# Patient Record
Sex: Male | Born: 1965 | Race: Black or African American | Hispanic: No | Smoking: Never smoker
Health system: Southern US, Community
[De-identification: ages and names within clinical notes are randomized; demographics above are authoritative.]

## PROBLEM LIST (undated history)

## (undated) DIAGNOSIS — E119 Type 2 diabetes mellitus without complications: Secondary | ICD-10-CM

## (undated) DIAGNOSIS — Z8619 Personal history of other infectious and parasitic diseases: Secondary | ICD-10-CM

## (undated) DIAGNOSIS — I10 Essential (primary) hypertension: Secondary | ICD-10-CM

## (undated) DIAGNOSIS — B1910 Unspecified viral hepatitis B without hepatic coma: Secondary | ICD-10-CM

## (undated) DIAGNOSIS — Z227 Latent tuberculosis: Secondary | ICD-10-CM

## (undated) HISTORY — PX: LIVER TRANSPLANT: SHX410

## (undated) HISTORY — PX: GASTROSTOMY TUBE PLACEMENT: SHX655

---

## 2016-12-27 HISTORY — PX: VENTRAL HERNIA REPAIR: SHX424

## 2017-08-27 DIAGNOSIS — I201 Angina pectoris with documented spasm: Secondary | ICD-10-CM

## 2017-08-27 HISTORY — DX: Angina pectoris with documented spasm: I20.1

## 2018-12-11 HISTORY — PX: VENTRAL HERNIA REPAIR: SHX424

## 2019-01-27 DIAGNOSIS — S069XAA Unspecified intracranial injury with loss of consciousness status unknown, initial encounter: Secondary | ICD-10-CM

## 2019-01-27 DIAGNOSIS — S066X9A Traumatic subarachnoid hemorrhage with loss of consciousness of unspecified duration, initial encounter: Secondary | ICD-10-CM

## 2019-01-27 DIAGNOSIS — S066XAA Traumatic subarachnoid hemorrhage with loss of consciousness status unknown, initial encounter: Secondary | ICD-10-CM

## 2019-01-27 DIAGNOSIS — S069X9A Unspecified intracranial injury with loss of consciousness of unspecified duration, initial encounter: Secondary | ICD-10-CM

## 2019-01-27 HISTORY — DX: Traumatic subarachnoid hemorrhage with loss of consciousness status unknown, initial encounter: S06.6XAA

## 2019-01-27 HISTORY — DX: Traumatic subarachnoid hemorrhage with loss of consciousness of unspecified duration, initial encounter: S06.6X9A

## 2019-01-27 HISTORY — DX: Unspecified intracranial injury with loss of consciousness status unknown, initial encounter: S06.9XAA

## 2019-01-27 HISTORY — DX: Unspecified intracranial injury with loss of consciousness of unspecified duration, initial encounter: S06.9X9A

## 2019-02-24 ENCOUNTER — Emergency Department: Payer: Medicaid Other

## 2019-02-24 ENCOUNTER — Encounter: Payer: Self-pay | Admitting: Radiology

## 2019-02-24 ENCOUNTER — Emergency Department
Admission: EM | Admit: 2019-02-24 | Discharge: 2019-02-24 | Disposition: A | Discharge status: Other Acute Inpatient Hospital | Payer: Medicaid Other | Attending: Emergency Medicine | Admitting: Emergency Medicine

## 2019-02-24 DIAGNOSIS — Z7984 Long term (current) use of oral hypoglycemic drugs: Secondary | ICD-10-CM | POA: Diagnosis not present

## 2019-02-24 DIAGNOSIS — Z7982 Long term (current) use of aspirin: Secondary | ICD-10-CM | POA: Insufficient documentation

## 2019-02-24 DIAGNOSIS — H05233 Hemorrhage of bilateral orbit: Secondary | ICD-10-CM

## 2019-02-24 DIAGNOSIS — Z79899 Other long term (current) drug therapy: Secondary | ICD-10-CM | POA: Insufficient documentation

## 2019-02-24 DIAGNOSIS — T148XXA Other injury of unspecified body region, initial encounter: Secondary | ICD-10-CM

## 2019-02-24 DIAGNOSIS — Y939 Activity, unspecified: Secondary | ICD-10-CM | POA: Insufficient documentation

## 2019-02-24 DIAGNOSIS — Y999 Unspecified external cause status: Secondary | ICD-10-CM | POA: Insufficient documentation

## 2019-02-24 DIAGNOSIS — S0083XA Contusion of other part of head, initial encounter: Secondary | ICD-10-CM | POA: Diagnosis not present

## 2019-02-24 DIAGNOSIS — S0003XA Contusion of scalp, initial encounter: Secondary | ICD-10-CM

## 2019-02-24 DIAGNOSIS — Y92009 Unspecified place in unspecified non-institutional (private) residence as the place of occurrence of the external cause: Secondary | ICD-10-CM | POA: Diagnosis not present

## 2019-02-24 DIAGNOSIS — R58 Hemorrhage, not elsewhere classified: Secondary | ICD-10-CM

## 2019-02-24 DIAGNOSIS — S1093XA Contusion of unspecified part of neck, initial encounter: Secondary | ICD-10-CM

## 2019-02-24 DIAGNOSIS — S0990XA Unspecified injury of head, initial encounter: Secondary | ICD-10-CM | POA: Diagnosis present

## 2019-02-24 LAB — CBC WITH DIFFERENTIAL/PLATELET
Abs Immature Granulocytes: 0.12 10*3/uL — ABNORMAL HIGH (ref 0.00–0.07)
Basophils Absolute: 0.1 10*3/uL (ref 0.0–0.1)
Basophils Relative: 0 %
Eosinophils Absolute: 0.1 10*3/uL (ref 0.0–0.5)
Eosinophils Relative: 0 %
HCT: 44.9 % (ref 39.0–52.0)
Hemoglobin: 14 g/dL (ref 13.0–17.0)
Immature Granulocytes: 1 %
Lymphocytes Relative: 14 %
Lymphs Abs: 2.2 10*3/uL (ref 0.7–4.0)
MCH: 26.6 pg (ref 26.0–34.0)
MCHC: 31.2 g/dL (ref 30.0–36.0)
MCV: 85.4 fL (ref 80.0–100.0)
Monocytes Absolute: 1.6 10*3/uL — ABNORMAL HIGH (ref 0.1–1.0)
Monocytes Relative: 10 %
NEUTROS ABS: 11.8 10*3/uL — AB (ref 1.7–7.7)
Neutrophils Relative %: 75 %
Platelets: 328 10*3/uL (ref 150–400)
RBC: 5.26 MIL/uL (ref 4.22–5.81)
RDW: 16.9 % — ABNORMAL HIGH (ref 11.5–15.5)
WBC: 15.8 10*3/uL — ABNORMAL HIGH (ref 4.0–10.5)
nRBC: 0 % (ref 0.0–0.2)

## 2019-02-24 LAB — URINALYSIS, COMPLETE (UACMP) WITH MICROSCOPIC
Bacteria, UA: NONE SEEN
Bilirubin Urine: NEGATIVE
Glucose, UA: 50 mg/dL — AB
KETONES UR: NEGATIVE mg/dL
Leukocytes,Ua: NEGATIVE
Nitrite: NEGATIVE
Protein, ur: 30 mg/dL — AB
Specific Gravity, Urine: >1.046 — ABNORMAL HIGH (ref 1.005–1.030)
Squamous Epithelial / HPF: NONE SEEN (ref 0–5)
pH: 5 (ref 5.0–8.0)

## 2019-02-24 LAB — COMPREHENSIVE METABOLIC PANEL
ALT: 94 U/L — ABNORMAL HIGH (ref 0–44)
AST: 175 U/L — ABNORMAL HIGH (ref 15–41)
Albumin: 4.3 g/dL (ref 3.5–5.0)
Alkaline Phosphatase: 73 U/L (ref 38–126)
Anion gap: 13 (ref 5–15)
BUN: 20 mg/dL (ref 6–20)
CO2: 21 mmol/L — AB (ref 22–32)
Calcium: 8.3 mg/dL — ABNORMAL LOW (ref 8.9–10.3)
Chloride: 105 mmol/L (ref 98–111)
Creatinine, Ser: 1.33 mg/dL — ABNORMAL HIGH (ref 0.61–1.24)
GFR calc Af Amer: >60 mL/min (ref >60)
GFR calc non Af Amer: >60 mL/min (ref >60)
Glucose, Bld: 250 mg/dL — ABNORMAL HIGH (ref 70–99)
Potassium: 3.7 mmol/L (ref 3.5–5.1)
SODIUM: 139 mmol/L (ref 135–145)
Total Bilirubin: 0.9 mg/dL (ref 0.3–1.2)
Total Protein: 8.1 g/dL (ref 6.5–8.1)

## 2019-02-24 LAB — BLOOD GAS, ARTERIAL
Acid-base deficit: 7.1 mmol/L — ABNORMAL HIGH (ref 0.0–2.0)
Bicarbonate: 19.3 mmol/L — ABNORMAL LOW (ref 20.0–28.0)
FIO2: 0.7
MECHVT: 550 mL
O2 Saturation: 99.8 %
PATIENT TEMPERATURE: 37
PEEP: 5 cmH2O
PH ART: 7.28 — AB (ref 7.350–7.450)
RATE: 16 resp/min
pCO2 arterial: 41 mmHg (ref 32.0–48.0)
pO2, Arterial: 271 mmHg — ABNORMAL HIGH (ref 83.0–108.0)

## 2019-02-24 LAB — URINE DRUG SCREEN, QUALITATIVE (ARMC ONLY)
Amphetamines, Ur Screen: NOT DETECTED
BENZODIAZEPINE, UR SCRN: NOT DETECTED
Barbiturates, Ur Screen: NOT DETECTED
Cannabinoid 50 Ng, Ur ~~LOC~~: POSITIVE — AB
Cocaine Metabolite,Ur ~~LOC~~: NOT DETECTED
MDMA (Ecstasy)Ur Screen: NOT DETECTED
Methadone Scn, Ur: NOT DETECTED
Opiate, Ur Screen: NOT DETECTED
Phencyclidine (PCP) Ur S: NOT DETECTED
Tricyclic, Ur Screen: NOT DETECTED

## 2019-02-24 LAB — LIPASE, BLOOD: Lipase: 26 U/L (ref 11–51)

## 2019-02-24 LAB — ETHANOL: Alcohol, Ethyl (B): 95 mg/dL — ABNORMAL HIGH (ref <10)

## 2019-02-24 MED ORDER — ACETAMINOPHEN 650 MG/20.3ML PO SOLN
650.00 | ORAL | Status: DC
Start: 2019-03-01 — End: 2019-02-24

## 2019-02-24 MED ORDER — PROPOFOL 1000 MG/100ML IV EMUL
INTRAVENOUS | Status: AC
  Filled 2019-02-24: qty 100

## 2019-02-24 MED ORDER — ETOMIDATE 2 MG/ML IV SOLN
20.0000 mg | Freq: Once | INTRAVENOUS | Status: AC
  Administered 2019-02-24: 20 mg via INTRAVENOUS

## 2019-02-24 MED ORDER — PROPOFOL 100 MG/10ML IV EMUL
0.00 | INTRAVENOUS | Status: DC
Start: ? — End: 2019-02-24

## 2019-02-24 MED ORDER — SUCCINYLCHOLINE CHLORIDE 20 MG/ML IJ SOLN
200.0000 mg | Freq: Once | INTRAMUSCULAR | Status: AC
  Administered 2019-02-24: 200 mg via INTRAVENOUS

## 2019-02-24 MED ORDER — IOHEXOL 300 MG/ML  SOLN
125.0000 mL | Freq: Once | INTRAMUSCULAR | Status: AC | PRN
  Administered 2019-02-24: 125 mL via INTRAVENOUS

## 2019-02-24 MED ORDER — GENERIC EXTERNAL MEDICATION
5.00 | Status: DC
Start: ? — End: 2019-02-24

## 2019-02-24 MED ORDER — GENERIC EXTERNAL MEDICATION
15.00 | Status: DC
Start: ? — End: 2019-02-24

## 2019-02-24 MED ORDER — CHLORHEXIDINE GLUCONATE 0.12 % MT SOLN
5.00 | OROMUCOSAL | Status: DC
Start: 2019-03-01 — End: 2019-02-24

## 2019-02-24 MED ORDER — ASPIRIN 81 MG PO CHEW
81.00 | CHEWABLE_TABLET | ORAL | Status: DC
Start: 2019-02-25 — End: 2019-02-24

## 2019-02-24 MED ORDER — DEXMEDETOMIDINE HCL IN NACL 200 MCG/50ML IV SOLN
0.00 | INTRAVENOUS | Status: DC
Start: ? — End: 2019-02-24

## 2019-02-24 MED ORDER — FENTANYL 2500MCG IN NS 250ML (10MCG/ML) PREMIX INFUSION
50.0000 ug/h | INTRAVENOUS | Status: DC
  Administered 2019-02-24: 50 ug/h via INTRAVENOUS

## 2019-02-24 MED ORDER — ATORVASTATIN CALCIUM 40 MG PO TABS
40.00 | ORAL_TABLET | ORAL | Status: DC
Start: 2019-03-01 — End: 2019-02-24

## 2019-02-24 MED ORDER — LACTATED RINGERS IV SOLN
100.00 | INTRAVENOUS | Status: DC
Start: ? — End: 2019-02-24

## 2019-02-24 MED ORDER — INSULIN REGULAR HUMAN 100 UNIT/ML IJ SOLN
0.00 | INTRAMUSCULAR | Status: DC
Start: 2019-02-25 — End: 2019-02-24

## 2019-02-24 MED ORDER — ENTECAVIR 0.05 MG/ML PO SOLN
1.00 | ORAL | Status: DC
Start: 2019-03-01 — End: 2019-02-24

## 2019-02-24 MED ORDER — POTASSIUM CHLORIDE 10 MEQ/100ML IV SOLN
10.00 | INTRAVENOUS | Status: DC
Start: ? — End: 2019-02-24

## 2019-02-24 MED ORDER — LIDOCAINE HCL (CARDIAC) PF 100 MG/5ML IV SOSY
100.0000 mg | PREFILLED_SYRINGE | Freq: Once | INTRAVENOUS | Status: DC
  Administered 2019-02-24: 100 mg via INTRAVENOUS

## 2019-02-24 MED ORDER — IOHEXOL 240 MG/ML SOLN
50.00 | INTRAMUSCULAR | Status: DC
Start: 2019-02-24 — End: 2019-02-24

## 2019-02-24 MED ORDER — GENERIC EXTERNAL MEDICATION
30.00 | Status: DC
Start: ? — End: 2019-02-24

## 2019-02-24 MED ORDER — HYDRALAZINE HCL 20 MG/ML IJ SOLN
20.00 | INTRAMUSCULAR | Status: DC
Start: ? — End: 2019-02-24

## 2019-02-24 MED ORDER — GENERIC EXTERNAL MEDICATION
3.00 | Status: DC
Start: 2019-02-27 — End: 2019-02-24

## 2019-02-24 MED ORDER — SODIUM CHLORIDE 0.9 % IV BOLUS
1000.0000 mL | Freq: Once | INTRAVENOUS | Status: AC
  Administered 2019-02-24: 1000 mL via INTRAVENOUS

## 2019-02-24 MED ORDER — MANNITOL 20 % IV SOLN
100.0000 g | Freq: Once | Status: AC
  Administered 2019-02-24: 100 g via INTRAVENOUS
  Filled 2019-02-24 (×2): qty 500

## 2019-02-24 MED ORDER — GENERIC EXTERNAL MEDICATION
2.00 | Status: DC
Start: ? — End: 2019-02-24

## 2019-02-24 MED ORDER — LABETALOL HCL 5 MG/ML IV SOLN
20.00 | INTRAVENOUS | Status: DC
Start: ? — End: 2019-02-24

## 2019-02-24 MED ORDER — GABAPENTIN 100 MG PO CAPS
100.00 | ORAL_CAPSULE | ORAL | Status: DC
Start: 2019-03-01 — End: 2019-02-24

## 2019-02-24 MED ORDER — FENTANYL 2500MCG IN NS 250ML (10MCG/ML) PREMIX INFUSION
INTRAVENOUS | Status: AC
  Filled 2019-02-24: qty 250

## 2019-02-24 MED ORDER — GENERIC EXTERNAL MEDICATION
1.00 | Status: DC
Start: ? — End: 2019-02-24

## 2019-02-24 MED ORDER — PANTOPRAZOLE SODIUM 40 MG IV SOLR
40.00 | INTRAVENOUS | Status: DC
Start: 2019-02-25 — End: 2019-02-24

## 2019-02-24 MED ORDER — FENTANYL CITRATE 2500 MCG/50ML IV SOLN
0.00 | INTRAVENOUS | Status: DC
Start: ? — End: 2019-02-24

## 2019-02-24 MED ORDER — ISOSORBIDE DINITRATE 10 MG PO TABS
10.00 | ORAL_TABLET | ORAL | Status: DC
Start: 2019-03-01 — End: 2019-02-24

## 2019-02-24 MED ORDER — PROPOFOL 1000 MG/100ML IV EMUL
5.0000 ug/kg/min | INTRAVENOUS | Status: DC
  Administered 2019-02-24: 10 ug/kg/min via INTRAVENOUS
  Administered 2019-02-24: 15 ug/kg/min via INTRAVENOUS

## 2019-02-24 MED ORDER — MAGNESIUM SULFATE 2 GM/50ML IV SOLN
2.00 | INTRAVENOUS | Status: DC
Start: ? — End: 2019-02-24

## 2019-02-24 NOTE — ED Notes (Signed)
Pt to ct with RN sherrie, ed tech and RT.

## 2019-02-24 NOTE — ED Notes (Signed)
Pt with rolled towels to protect c spine prior to transport to CT.

## 2019-02-24 NOTE — ED Notes (Signed)
Pt to helicopter.

## 2019-02-24 NOTE — Progress Notes (Signed)
Patient switched over to [employer] vent and monitor at approximately 0150. [employer] and RT noted patient exhibiting signs of breathing around ETT, SAT at 100%. ETT secured at 19cm. MD to assess. Per MD, ETT to be advanced from 19 to 21cm. RT advanced tube to 21 with no complications however, Patient's SATS began to drop. Patient was bagged. ETT removed and patient reintubated with 7.5 ETT secured at 25cm at the lip, positive color change on CO2, bilateral breath sounds confirmed, patient placed on [employer]'s vent, SAT at 100%
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
## 2019-02-24 NOTE — Progress Notes (Addendum)
FIO2 decreased from 100% to 70% per ABG results and MD verbal order

## 2019-02-24 NOTE — ED Notes (Addendum)
RT in to advance ETT. No color change on end tidal.no chest rise and fall noted at this time. No breath sounds on auscultation with bagging per RT. MD in to reintubate pt.

## 2019-02-24 NOTE — ED Notes (Signed)
Pt with drop in POX to 80s. No breath sounds auscultated per RN with Teena Irani. Pt was previously transferred to [facility] ventilator by [facility] RN at bedside. Ventilator states no tidal volume. RT at bedside.

## 2019-02-24 NOTE — ED Notes (Signed)
Cannot see pupils, eyes are swollen shut. Pt with dark bruising noted around eyes, no drainage noted from ears.
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
## 2019-02-24 NOTE — ED Notes (Addendum)
[employer] officers have pt's clothing and belongings. Officer foust with [employer] has belongings.

## 2019-02-24 NOTE — ED Notes (Addendum)
MD assessing ETT. NG removed, will replace with same size after reintubation.

## 2019-02-24 NOTE — Progress Notes (Signed)
Patient transported to CT on vent. RN x2 and ED tech with patient. Patient suctioned in CT, thick red bloody secretions. Patient vomited during CT, suctioned immediately. Patient transported back to ED room with no complications.

## 2019-02-24 NOTE — ED Notes (Signed)
Pt switched to [facility] vent and monitor.

## 2019-02-24 NOTE — Progress Notes (Signed)
Patient airway secured at 25cm at lip, patient placed on [facility] transport vent. SAT at 100%.

## 2019-02-24 NOTE — Progress Notes (Signed)
Chest xray confirmed ETT in good position.

## 2019-02-24 NOTE — ED Notes (Signed)
[facility] states will discontinue fentanyl immediately prior to leaving room and give their own pushes.

## 2019-02-24 NOTE — ED Notes (Signed)
Head of bed kept at 30 degrees

## 2019-02-27 MED ORDER — INSULIN REGULAR HUMAN 100 UNIT/ML IJ SOLN
0.00 | INTRAMUSCULAR | Status: DC
Start: 2019-03-01 — End: 2019-02-27

## 2019-02-27 MED ORDER — DOCUSATE SODIUM 100 MG PO CAPS
100.00 | ORAL_CAPSULE | ORAL | Status: DC
Start: 2019-03-01 — End: 2019-02-27

## 2019-02-27 MED ORDER — INSULIN NPH (HUMAN) (ISOPHANE) 100 UNIT/ML ~~LOC~~ SUSP
10.00 | SUBCUTANEOUS | Status: DC
Start: 2019-02-27 — End: 2019-02-27

## 2019-02-27 MED ORDER — AMOXICILLIN-POT CLAVULANATE 875-125 MG PO TABS
1.00 | ORAL_TABLET | ORAL | Status: DC
Start: 2019-03-01 — End: 2019-02-27

## 2019-02-27 MED ORDER — GENERIC EXTERNAL MEDICATION
1.00 | Status: DC
Start: ? — End: 2019-02-27

## 2019-02-27 MED ORDER — GENERIC EXTERNAL MEDICATION
10.00 | Status: DC
Start: ? — End: 2019-02-27

## 2019-02-27 MED ORDER — AMLODIPINE BESYLATE 10 MG PO TABS
10.00 | ORAL_TABLET | ORAL | Status: DC
Start: 2019-03-01 — End: 2019-02-27

## 2019-02-27 MED ORDER — REFRESH P.M. OP OINT
1.00 | TOPICAL_OINTMENT | OPHTHALMIC | Status: DC
Start: 2019-03-01 — End: 2019-02-27

## 2019-02-27 MED ORDER — DEXMEDETOMIDINE HCL IN NACL 200 MCG/50ML IV SOLN
0.00 | INTRAVENOUS | Status: DC
Start: ? — End: 2019-02-27

## 2019-02-27 MED ORDER — GENERIC EXTERNAL MEDICATION
40.00 | Status: DC
Start: 2019-03-01 — End: 2019-02-27

## 2019-02-27 MED ORDER — POLYETHYLENE GLYCOL 3350 17 G PO PACK
17.00 | PACK | ORAL | Status: DC
Start: 2019-03-01 — End: 2019-02-27

## 2019-02-27 MED ORDER — LOSARTAN POTASSIUM 25 MG PO TABS
25.00 | ORAL_TABLET | ORAL | Status: DC
Start: 2019-03-01 — End: 2019-02-27

## 2019-03-01 MED ORDER — GENERIC EXTERNAL MEDICATION
2.50 | Status: DC
Start: 2019-03-01 — End: 2019-03-01

## 2019-03-01 MED ORDER — GENERIC EXTERNAL MEDICATION
1.00 | Status: DC
Start: ? — End: 2019-03-01

## 2019-03-01 MED ORDER — FUROSEMIDE 10 MG/ML IJ SOLN
20.00 | INTRAMUSCULAR | Status: DC
Start: 2019-03-01 — End: 2019-03-01

## 2019-03-01 MED ORDER — INSULIN NPH (HUMAN) (ISOPHANE) 100 UNIT/ML ~~LOC~~ SUSP
30.00 | SUBCUTANEOUS | Status: DC
Start: 2019-03-01 — End: 2019-03-01

## 2019-03-01 MED ORDER — ERGOCALCIFEROL 200 MCG/ML PO SOLN
48000.00 | ORAL | Status: DC
Start: 2019-03-07 — End: 2019-03-01

## 2019-03-01 MED ORDER — ENOXAPARIN SODIUM 40 MG/0.4ML ~~LOC~~ SOLN
40.00 | SUBCUTANEOUS | Status: DC
Start: 2019-03-01 — End: 2019-03-01

## 2019-03-01 MED ORDER — GENERIC EXTERNAL MEDICATION
2.50 | Status: DC
Start: ? — End: 2019-03-01

## 2019-03-01 MED ORDER — POTASSIUM CHLORIDE 20 MEQ/15ML (10%) PO SOLN
20.00 | ORAL | Status: DC
Start: 2019-03-01 — End: 2019-03-01

## 2019-03-01 MED ORDER — FLUTICASONE PROPIONATE 50 MCG/ACT NA SUSP
2.00 | NASAL | Status: DC
Start: 2019-03-01 — End: 2019-03-01

## 2019-06-06 ENCOUNTER — Observation Stay
Admission: EM | Admit: 2019-06-06 | Discharge: 2019-06-09 | Disposition: A | Discharge status: Other Acute Inpatient Hospital | Payer: Medicaid Other | Attending: Family Medicine | Admitting: Family Medicine

## 2019-06-06 ENCOUNTER — Other Ambulatory Visit: Payer: Self-pay

## 2019-06-06 ENCOUNTER — Emergency Department: Payer: Medicaid Other

## 2019-06-06 ENCOUNTER — Encounter: Payer: Self-pay | Admitting: Emergency Medicine

## 2019-06-06 DIAGNOSIS — Z7982 Long term (current) use of aspirin: Secondary | ICD-10-CM | POA: Insufficient documentation

## 2019-06-06 DIAGNOSIS — R1084 Generalized abdominal pain: Secondary | ICD-10-CM

## 2019-06-06 DIAGNOSIS — U071 COVID-19: Secondary | ICD-10-CM | POA: Diagnosis not present

## 2019-06-06 DIAGNOSIS — I10 Essential (primary) hypertension: Secondary | ICD-10-CM | POA: Diagnosis not present

## 2019-06-06 DIAGNOSIS — K5900 Constipation, unspecified: Secondary | ICD-10-CM | POA: Diagnosis not present

## 2019-06-06 DIAGNOSIS — Z87820 Personal history of traumatic brain injury: Secondary | ICD-10-CM | POA: Insufficient documentation

## 2019-06-06 DIAGNOSIS — E119 Type 2 diabetes mellitus without complications: Secondary | ICD-10-CM | POA: Diagnosis not present

## 2019-06-06 DIAGNOSIS — Z7401 Bed confinement status: Secondary | ICD-10-CM | POA: Insufficient documentation

## 2019-06-06 DIAGNOSIS — Z79899 Other long term (current) drug therapy: Secondary | ICD-10-CM | POA: Insufficient documentation

## 2019-06-06 DIAGNOSIS — Z944 Liver transplant status: Secondary | ICD-10-CM | POA: Diagnosis not present

## 2019-06-06 DIAGNOSIS — Z931 Gastrostomy status: Secondary | ICD-10-CM | POA: Diagnosis not present

## 2019-06-06 DIAGNOSIS — R197 Diarrhea, unspecified: Principal | ICD-10-CM | POA: Insufficient documentation

## 2019-06-06 DIAGNOSIS — R109 Unspecified abdominal pain: Secondary | ICD-10-CM | POA: Diagnosis present

## 2019-06-06 DIAGNOSIS — Z7984 Long term (current) use of oral hypoglycemic drugs: Secondary | ICD-10-CM | POA: Insufficient documentation

## 2019-06-06 HISTORY — DX: Personal history of other infectious and parasitic diseases: Z86.19

## 2019-06-06 HISTORY — DX: Unspecified viral hepatitis B without hepatic coma: B19.10

## 2019-06-06 HISTORY — DX: Latent tuberculosis: Z22.7

## 2019-06-06 HISTORY — DX: Type 2 diabetes mellitus without complications: E11.9

## 2019-06-06 HISTORY — DX: Essential (primary) hypertension: I10

## 2019-06-06 LAB — COMPREHENSIVE METABOLIC PANEL
ALT: 27 U/L (ref 0–44)
AST: 19 U/L (ref 15–41)
Albumin: 4.3 g/dL (ref 3.5–5.0)
Alkaline Phosphatase: 114 U/L (ref 38–126)
Anion gap: 11 (ref 5–15)
BUN: 41 mg/dL — ABNORMAL HIGH (ref 6–20)
CO2: 19 mmol/L — ABNORMAL LOW (ref 22–32)
Calcium: 9.3 mg/dL (ref 8.9–10.3)
Chloride: 106 mmol/L (ref 98–111)
Creatinine, Ser: 1.62 mg/dL — ABNORMAL HIGH (ref 0.61–1.24)
GFR calc Af Amer: 55 mL/min — ABNORMAL LOW (ref >60)
GFR calc non Af Amer: 47 mL/min — ABNORMAL LOW (ref >60)
Glucose, Bld: 111 mg/dL — ABNORMAL HIGH (ref 70–99)
Potassium: 4.7 mmol/L (ref 3.5–5.1)
Sodium: 136 mmol/L (ref 135–145)
Total Bilirubin: 1 mg/dL (ref 0.3–1.2)
Total Protein: 8.1 g/dL (ref 6.5–8.1)

## 2019-06-06 LAB — URINALYSIS, COMPLETE (UACMP) WITH MICROSCOPIC
Bacteria, UA: NONE SEEN
Bilirubin Urine: NEGATIVE
Glucose, UA: NEGATIVE mg/dL
Hgb urine dipstick: NEGATIVE
Ketones, ur: NEGATIVE mg/dL
Leukocytes,Ua: NEGATIVE
Nitrite: NEGATIVE
Protein, ur: NEGATIVE mg/dL
Specific Gravity, Urine: 1.025 (ref 1.005–1.030)
Squamous Epithelial / HPF: NONE SEEN (ref 0–5)
pH: 5 (ref 5.0–8.0)

## 2019-06-06 LAB — CBC WITH DIFFERENTIAL/PLATELET
Abs Immature Granulocytes: 0.02 10*3/uL (ref 0.00–0.07)
Basophils Absolute: 0.1 10*3/uL (ref 0.0–0.1)
Basophils Relative: 1 %
Eosinophils Absolute: 0.9 10*3/uL — ABNORMAL HIGH (ref 0.0–0.5)
Eosinophils Relative: 12 %
HCT: 34.5 % — ABNORMAL LOW (ref 39.0–52.0)
Hemoglobin: 11.1 g/dL — ABNORMAL LOW (ref 13.0–17.0)
Immature Granulocytes: 0 %
Lymphocytes Relative: 23 %
Lymphs Abs: 1.8 10*3/uL (ref 0.7–4.0)
MCH: 28 pg (ref 26.0–34.0)
MCHC: 32.2 g/dL (ref 30.0–36.0)
MCV: 86.9 fL (ref 80.0–100.0)
Monocytes Absolute: 0.9 10*3/uL (ref 0.1–1.0)
Monocytes Relative: 11 %
Neutro Abs: 4.3 10*3/uL (ref 1.7–7.7)
Neutrophils Relative %: 53 %
Platelets: 262 10*3/uL (ref 150–400)
RBC: 3.97 MIL/uL — ABNORMAL LOW (ref 4.22–5.81)
RDW: 14.8 % (ref 11.5–15.5)
WBC: 8 10*3/uL (ref 4.0–10.5)
nRBC: 0 % (ref 0.0–0.2)

## 2019-06-06 LAB — LIPASE, BLOOD: Lipase: 23 U/L (ref 11–51)

## 2019-06-06 MED ORDER — IOHEXOL 240 MG/ML SOLN
50.0000 mL | Freq: Once | INTRAMUSCULAR | Status: AC | PRN
  Administered 2019-06-06: 50 mL via ORAL

## 2019-06-06 MED ORDER — SODIUM CHLORIDE 0.9 % IV BOLUS
1000.0000 mL | Freq: Once | INTRAVENOUS | Status: AC
  Administered 2019-06-06: 21:00:00 1000 mL via INTRAVENOUS

## 2019-06-06 MED ORDER — IOHEXOL 300 MG/ML  SOLN
100.0000 mL | Freq: Once | INTRAMUSCULAR | Status: AC | PRN
  Administered 2019-06-06: 21:00:00 100 mL via INTRAVENOUS

## 2019-06-06 NOTE — ED Notes (Signed)
Pt incontinent of stool. Large bowel movement present. Cleaned pt, changed bed linens, and provided for comfort and safety. Pt tolerated well.
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
## 2019-06-06 NOTE — ED Notes (Signed)
Pt had a bowel movement, this EDT, EDT Holli Humbles, and RN Seth Bake changed the pt and collected a urine sample.

## 2019-06-06 NOTE — ED Notes (Signed)
Pt drinking CT contrast 

## 2019-06-06 NOTE — ED Notes (Signed)
Dr. Cinda Quest at the bedside for pt evaluation
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
## 2019-06-07 NOTE — ED Notes (Signed)
Pt assisted with use of urinal. Tolerated well. Provided for comfort and safety and will continue to assess.

## 2019-06-07 NOTE — ED Notes (Signed)
Pt resting on stretcher. Lights off to enhance rest. Eyes closed and even respirations. No increased work of breathing noted at this time. Repositioned pt and provided for comfort and safety and will continue to assess.

## 2019-06-07 NOTE — NC FL2 (Signed)
[place] MEDICAID FL2 LEVEL OF CARE SCREENING TOOL     IDENTIFICATION  Patient Name: Newman NickelsJohn Matthew Oxendine Birthdate: 01-Jun-1966 Sex: male Admission Date (Current Location): 06/06/2019  [place]  [place] [id number] [facility] Facility and Address:  [facility], [address], [place], [place] [address]      Provider Number:    Attending Physician Name and Address:  No att. providers found  Relative Name and Phone Number:  Randie HeinzDamon Jones [phone]    Current Level of Care: SNF Recommended Level of Care: Skilled Nursing Facility Prior Approval Number:    Date Approved/Denied:   PASRR Number: [id number] [id number]  Discharge Plan: SNF    Current Diagnoses: There are no active problems to display for this patient.   Orientation RESPIRATION BLADDER Height & Weight     Self, Time, Situation, Place  Normal Continent Weight: 97.1 kg Height:  5\' 11"  (180.3 cm)  BEHAVIORAL SYMPTOMS/MOOD NEUROLOGICAL BOWEL NUTRITION STATUS      Continent Diet, Feeding tube(has a G tube but can eat by mouth with help)  AMBULATORY STATUS COMMUNICATION OF NEEDS Skin   Extensive Assist Verbally Normal                       Personal Care Assistance Level of Assistance  Bathing, Feeding, Dressing Bathing Assistance: Maximum assistance Feeding assistance: Maximum assistance Dressing Assistance: Maximum assistance     Functional Limitations Info             SPECIAL CARE FACTORS FREQUENCY  PT (By licensed PT), OT (By licensed OT)     PT Frequency: 5 times/ week OT Frequency: 5 times/week            Contractures Contractures Info: Not present    Additional Factors Info  Code Status, Allergies Code Status Info: Full Allergies Info: NKDA           Current Medications (06/07/2019):  This is the current hospital active medication list No current facility-administered medications for this encounter.    Current Outpatient Medications  Medication  Sig Dispense Refill  . acetaminophen (TYLENOL) 325 MG tablet Take 650 mg by mouth every 6 (six) hours as needed.     Marland Kitchen. aspirin EC 81 MG tablet Take 81 mg by mouth daily.     Marland Kitchen. atorvastatin (LIPITOR) 40 MG tablet Take 40 mg by mouth daily at 6 PM.     . entecavir (BARACLUDE) 1 MG tablet TAKE 1 TABLET BY MOUTH EVERY DAY    . gabapentin (NEURONTIN) 100 MG capsule Take 100 mg by mouth 3 (three) times daily.    . isosorbide mononitrate (IMDUR) 30 MG 24 hr tablet Take 30 mg by mouth daily.     Marland Kitchen. losartan (COZAAR) 25 MG tablet Take 25 mg by mouth daily.     . magnesium oxide (MAG-OX) 400 MG tablet Take 400 mg by mouth 2 (two) times daily.     . metFORMIN (GLUCOPHAGE) 1000 MG tablet Take 1,000 mg by mouth 2 (two) times daily with a meal.     . NIFEdipine (PROCARDIA XL/NIFEDICAL XL) 60 MG 24 hr tablet Take 120 mg by mouth daily.     Marland Kitchen. omeprazole (PRILOSEC) 20 MG capsule Take 20 mg by mouth 2 (two) times daily.    . polyethylene glycol powder (GLYCOLAX/MIRALAX) powder MIX 1 CAPFUL IN LIQUID AND DRINK DAILY    . tacrolimus (PROGRAF) 1 MG capsule Take 1 mg by mouth 2 (two) times daily.  Discharge Medications: Please see discharge summary for a list of discharge medications.  Relevant Imaging Results:  Relevant Lab Results:   Additional Information [phone]  Shelbie Hutching, RN

## 2019-06-07 NOTE — TOC Initial Note (Signed)
Transition of Care [facility]) - Initial/Assessment Note    Patient Details  Name: Terry Ibarra MRN: [id number] Date of Birth: 24-Dec-1966  [facility] CM/SW Contact:    Shelbie Hutching, RN Phone Number: 06/07/2019, 11:05 AM  Clinical Narrative:                 Patient is being seen in the ED for constipation.  Patient reports that he was discharged from [facility] in [place] last week.  Patient reports that the facility was in poor condition and he begged his daughter to get him out of there.  Patient was discharged home with his brother to an apartment on the second floor in [place].  Patient wants to go to a facility he states " there is no one to take care of me at home."  Patient is open to [facility] but they do not have nursing services available at this time, PT has been out once and OT has been out once, there has not been an aide out yet.  RNCM spoke with [facility] and they will refer patient for [facility] services and we can order nursing and social work.  All of this was explained to the patient's daughter and she was agreeable to try.  Home health was also explained to the patient and he wants to try for SNF.  TOC team sending referral for long term Medicaid beds.    Expected Discharge Plan: Skilled Nursing Facility Barriers to Discharge: SNF Pending bed offer   Patient Goals and CMS Choice Patient states their goals for this hospitalization and ongoing recovery are:: Patient wishes to go to skilled facility, he says no one to take care of me at home      Expected Discharge Plan and Services Expected Discharge Plan: [place] In-house Referral: Clinical Social Work Discharge Planning Services: CM Consult   Living arrangements for the past 2 months: Skilled Nursing Facility(Currently at home in apartment living with brother, discharged from [facility] 05/31/19)                                      Prior Living Arrangements/Services Living  arrangements for the past 2 months: Skilled Nursing Facility(Currently at home in apartment living with brother, discharged from [facility] 05/31/19) Lives with:: Siblings Patient language and need for interpreter reviewed:: No Do you feel safe going back to the place where you live?: No   Patient reports no one to take care of him  Need for Family Participation in Patient Care: Yes (Comment)(permanently disabled) Care giver support system in place?: Yes (comment)(brother and daughter) Current home services: DME, Home OT, Home PT, Homehealth aide(Home Health with [facility]- [facility]) Criminal Activity/Legal Involvement Pertinent to Current Situation/Hospitalization: No - Comment as needed  Activities of Daily Living      Permission Sought/Granted Permission sought to share information with : Case Manager, Customer service manager, Family Supports Permission granted to share information with : Yes, Verbal Permission Granted        Permission granted to share info w Relationship: Brother and daughter  Permission granted to share info w Contact Information: SNF  Emotional Assessment Appearance:: Appears stated age Attitude/Demeanor/Rapport: Engaged Affect (typically observed): Accepting Orientation: : Oriented to Self, Oriented to Place, Oriented to  Time, Oriented to Situation Alcohol / Substance Use: Not Applicable Psych Involvement: No (comment)  Admission diagnosis:  Abdominal Pain  There are no active problems to display for this patient.  PCP:  Gale JourneyWalsh, Catherine P, MD Pharmacy:  No Pharmacies Listed    Social Determinants of Health (SDOH) Interventions    Readmission Risk Interventions No flowsheet data found.

## 2019-06-07 NOTE — ED Notes (Signed)
Report given to Jennifer RN

## 2019-06-07 NOTE — TOC Progression Note (Signed)
Transition of Care [facility]) - Progression Note    Patient Details  Name: Terry Ibarra MRN: [id number] Date of Birth: 30-Oct-1966  Transition of Care [facility]) CM/SW Contact  Shelbie Hutching, RN Phone Number: 06/07/2019, 3:20 PM  Clinical Narrative:    Referral for bed sent to [facility] in [place] from [place] center reports that they do have a long term Medicaid bed available.  RNCM still waiting to hear from [employer] as well.    Expected Discharge Plan: Skilled Nursing Facility Barriers to Discharge: SNF Pending bed offer  Expected Discharge Plan and Services Expected Discharge Plan: [place] In-house Referral: Clinical Social Work Discharge Planning Services: CM Consult   Living arrangements for the past 2 months: Skilled Nursing Facility(Currently at home in apartment living with brother, discharged from [facility] 05/31/19)                                       Social Determinants of Health (SDOH) Interventions    Readmission Risk Interventions No flowsheet data found.

## 2019-06-07 NOTE — ED Notes (Signed)
Assisted pt with use of urinal. Repositioned pt for comfort and safety. Lights off to enhance rest. Will continue to assess.

## 2019-06-07 NOTE — ED Notes (Signed)
Pt call light ringing, this tech in to assist pt with using the phone.

## 2019-06-07 NOTE — ED Notes (Signed)
Assisted pt with use of urinal. Tolerated well. Alert and talkative at this time. Room darkened to enhance rest. Will continue to assess.

## 2019-06-07 NOTE — ED Notes (Signed)
Dining services notified order in and to please send tray

## 2019-06-07 NOTE — ED Notes (Signed)
Pt comfortable. Monitors off for sleep.

## 2019-06-08 ENCOUNTER — Encounter: Payer: Self-pay | Admitting: Family Medicine

## 2019-06-08 DIAGNOSIS — R197 Diarrhea, unspecified: Secondary | ICD-10-CM | POA: Diagnosis present

## 2019-06-08 LAB — GLUCOSE, CAPILLARY: Glucose-Capillary: 110 mg/dL — ABNORMAL HIGH (ref 70–99)

## 2019-06-08 LAB — SARS CORONAVIRUS 2 BY RT PCR (HOSPITAL ORDER, PERFORMED IN ~~LOC~~ HOSPITAL LAB): SARS Coronavirus 2: POSITIVE — AB

## 2019-06-08 MED ORDER — MAGNESIUM OXIDE 400 (241.3 MG) MG PO TABS
400.0000 mg | ORAL_TABLET | Freq: Two times a day (BID) | ORAL | Status: DC
  Administered 2019-06-08: 22:00:00 400 mg via ORAL
  Filled 2019-06-08 (×2): qty 1

## 2019-06-08 MED ORDER — PANTOPRAZOLE SODIUM 40 MG PO TBEC
40.0000 mg | DELAYED_RELEASE_TABLET | Freq: Every day | ORAL | Status: DC
  Administered 2019-06-08: 09:00:00 40 mg via ORAL
  Filled 2019-06-08: qty 1

## 2019-06-08 MED ORDER — FLUTICASONE PROPIONATE 50 MCG/ACT NA SUSP
2.0000 | Freq: Every day | NASAL | Status: DC
  Filled 2019-06-08: qty 16

## 2019-06-08 MED ORDER — NIFEDIPINE ER 60 MG PO TB24
120.0000 mg | ORAL_TABLET | Freq: Every day | ORAL | Status: DC
  Filled 2019-06-08: qty 2

## 2019-06-08 MED ORDER — ACETAMINOPHEN 500 MG PO TABS
1000.0000 mg | ORAL_TABLET | Freq: Once | ORAL | Status: AC
  Administered 2019-06-08: 06:00:00 1000 mg via ORAL
  Filled 2019-06-08: qty 2

## 2019-06-08 MED ORDER — LOSARTAN POTASSIUM 50 MG PO TABS
25.0000 mg | ORAL_TABLET | Freq: Every day | ORAL | Status: DC
  Administered 2019-06-08: 09:00:00 25 mg via ORAL
  Filled 2019-06-08: qty 1

## 2019-06-08 MED ORDER — INSULIN ASPART 100 UNIT/ML ~~LOC~~ SOLN: 0.0000 [IU] | Freq: Three times a day (TID) | SUBCUTANEOUS | Status: DC

## 2019-06-08 MED ORDER — METFORMIN HCL 500 MG PO TABS
1000.0000 mg | ORAL_TABLET | Freq: Two times a day (BID) | ORAL | Status: DC
  Administered 2019-06-08: 09:00:00 1000 mg via ORAL
  Filled 2019-06-08: qty 2

## 2019-06-08 MED ORDER — TACROLIMUS 1 MG PO CAPS
4.0000 mg | ORAL_CAPSULE | Freq: Two times a day (BID) | ORAL | Status: DC
  Administered 2019-06-08: 18:00:00 1 mg via ORAL
  Administered 2019-06-08: 22:00:00 4 mg via ORAL
  Filled 2019-06-08 (×2): qty 4

## 2019-06-08 MED ORDER — INSULIN REGULAR HUMAN 100 UNIT/ML IJ SOLN: 10.0000 [IU] | Freq: Two times a day (BID) | INTRAMUSCULAR | Status: DC

## 2019-06-08 MED ORDER — METFORMIN HCL 500 MG PO TABS
1000.0000 mg | ORAL_TABLET | Freq: Two times a day (BID) | ORAL | Status: DC
  Administered 2019-06-08: 18:00:00 1000 mg via ORAL
  Filled 2019-06-08 (×3): qty 2

## 2019-06-08 MED ORDER — GABAPENTIN 100 MG PO CAPS
100.0000 mg | ORAL_CAPSULE | Freq: Three times a day (TID) | ORAL | Status: DC
  Administered 2019-06-08 (×3): 100 mg via ORAL
  Filled 2019-06-08 (×5): qty 1

## 2019-06-08 MED ORDER — MAGNESIUM OXIDE 400 (241.3 MG) MG PO TABS
400.0000 mg | ORAL_TABLET | Freq: Two times a day (BID) | ORAL | Status: DC
  Administered 2019-06-08: 09:00:00 400 mg via ORAL
  Filled 2019-06-08: qty 1

## 2019-06-08 MED ORDER — TACROLIMUS 1 MG PO CAPS
1.0000 mg | ORAL_CAPSULE | Freq: Two times a day (BID) | ORAL | Status: DC
  Filled 2019-06-08: qty 1

## 2019-06-08 MED ORDER — LABETALOL HCL 200 MG PO TABS
200.0000 mg | ORAL_TABLET | Freq: Two times a day (BID) | ORAL | Status: DC
  Administered 2019-06-08: 22:00:00 200 mg via ORAL
  Filled 2019-06-08 (×2): qty 1

## 2019-06-08 MED ORDER — ISOSORBIDE MONONITRATE ER 60 MG PO TB24
30.0000 mg | ORAL_TABLET | Freq: Every day | ORAL | Status: DC
  Administered 2019-06-08: 09:00:00 30 mg via ORAL
  Filled 2019-06-08: qty 1

## 2019-06-08 MED ORDER — AMLODIPINE BESYLATE 5 MG PO TABS
10.0000 mg | ORAL_TABLET | Freq: Every day | ORAL | Status: DC
  Filled 2019-06-08: qty 2

## 2019-06-08 MED ORDER — VITAMIN B-12 100 MCG PO TABS
100.0000 ug | ORAL_TABLET | Freq: Every day | ORAL | Status: DC
  Filled 2019-06-08 (×2): qty 1

## 2019-06-08 MED ORDER — ACETAMINOPHEN 325 MG PO TABS: 650.0000 mg | ORAL_TABLET | Freq: Four times a day (QID) | ORAL | Status: DC | PRN

## 2019-06-08 MED ORDER — LOSARTAN POTASSIUM 25 MG PO TABS
25.0000 mg | ORAL_TABLET | Freq: Every day | ORAL | Status: DC
  Filled 2019-06-08: qty 1

## 2019-06-08 MED ORDER — ASPIRIN EC 81 MG PO TBEC
81.0000 mg | DELAYED_RELEASE_TABLET | Freq: Every day | ORAL | Status: DC
  Filled 2019-06-08: qty 1

## 2019-06-08 MED ORDER — ISOSORBIDE DINITRATE 20 MG PO TABS
20.0000 mg | ORAL_TABLET | Freq: Three times a day (TID) | ORAL | Status: DC
  Administered 2019-06-08: 22:00:00 20 mg via ORAL
  Filled 2019-06-08 (×3): qty 1

## 2019-06-08 MED ORDER — TACROLIMUS 5 MG/ML IV SOLN: 0.0300 mg/kg/d | INTRAVENOUS | Status: DC

## 2019-06-08 MED ORDER — ATORVASTATIN CALCIUM 20 MG PO TABS
40.0000 mg | ORAL_TABLET | Freq: Every day | ORAL | Status: DC
  Administered 2019-06-08: 40 mg via ORAL
  Filled 2019-06-08 (×2): qty 2

## 2019-06-08 MED ORDER — ENTECAVIR 1 MG PO TABS: 1.0000 mg | ORAL_TABLET | Freq: Every day | ORAL | Status: DC

## 2019-06-08 NOTE — ED Provider Notes (Signed)
-----------------------------------------   6:18 AM on 06/08/2019 -----------------------------------------  No events overnight.  Reordered patient's home medicines which will be verified by daytime pharmacy tech.  Remains in the ED pending clinical social work consult for placement.   Paulette Blanch, MD 06/08/19 [phone]

## 2019-06-08 NOTE — ED Provider Notes (Signed)
Patient is coronavirus positive.  He denies any cough or shortness of breath and is not hypoxic.  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 Was just with Trauma service under [facility]   Briefly, pt is 53 yo M with h/o cirrhosis, liver transplant at [facility] in 2017, s/p recent long admission 02-04 2020 at [facility] with Trauma for TBI, here with abdominal pain, nausea, diarrhea, fatigue and inability to care for himself. Labs here reassuring, CT neg, and plan was to dispo to SNF but pt found to be COVID positive. Given COVID+ with liver transplant, ongoing n/v/d, plan was to admit but [facility] unable to care for transplant pt per Dr. Lala Lund. Will discuss with [facility] re: transfer.   Duffy Bruce, MD 06/08/19 [phone]

## 2019-06-08 NOTE — ED Provider Notes (Addendum)
Notified by pharmacy that patient is no longer on many of his meds, which had been ordered overnight to be given this morning. His vitals are stable now. Most importantly, he is reportedly NOT on prograf nor is he on blood pressure medications. He was not given prograf but was given his blood pressure medications. Will have RN check his pressures regularly today while here and I've updated and adjusted his medications.   Addendum: I had a second discussion with pharmacy.  Per report, they had obtained history from his caregiver, who was not aware of the new medications that were started at his current nursing home.  They were able to obtain an MAR from his nursing home and meds have subsequently been adjusted.  Fortunately, he is on the current antihypertensive that he had received this morning, and has tolerated them well.  I have adjusted his medications and set them to start this afternoon.  Duffy Bruce, MD 06/08/19 [id number]    Duffy Bruce, MD 06/08/19 1031

## 2019-06-08 NOTE — ED Notes (Signed)
CARELINK  CALLED  

## 2019-06-08 NOTE — ED Notes (Signed)
Pt's brother pt talked at [phone] Pt's daughter pt called [phone]

## 2019-06-08 NOTE — TOC Transition Note (Signed)
Transition of Care [facility]) - CM/SW Discharge Note   Patient Details  Name: Terry Ibarra MRN: [id number] Date of Birth: 1966-10-31  [facility] Care [facility]) CM/SW Contact:  Latanya Maudlin, RN Phone Number: 06/08/2019, 4:00 PM   Clinical Narrative:  Patient COVID positive and per Claiborne Billings at [facility] they are unable to take patient. Patient from home with home health and Jackquline Denmark will not be able to see patient until he has been asymptomatic for 2 weeks. Patient now being transferred to [facility].     Barriers to Discharge: SNF Pending bed offer   Patient Goals and CMS Choice Patient states their goals for this hospitalization and ongoing recovery are:: Patient wishes to go to skilled facility, he says no one to take care of me at home      Discharge Placement                       Discharge Plan and Services In-house Referral: Clinical Social Work Discharge Planning Services: CM Consult                                 Social Determinants of Health (SDOH) Interventions     Readmission Risk Interventions No flowsheet data found.

## 2019-06-08 NOTE — ED Notes (Signed)
Pt assisted to reposition for comfort.  Pt care assumed.  Pt resting quietly in bed, NAD noted, no other needs voiced at this time.  Will monitor.

## 2019-06-08 NOTE — ED Notes (Signed)
Date and time results received: 06/08/19 12:05 PM  (use smartphrase ".now" to insert current time)  Test: Covid Critical Value: Positive  Name of Provider Notified: Dr. Ellender Hose  Orders Received? Or Actions Taken?: Critical Results Acknowledged

## 2019-06-08 NOTE — ED Notes (Signed)
Writer and Judson Roch EDT went to put condom Cath on pt  And pt stated that he didn't want it due to the fact that it causes him it get an infection and that it was no need because he was leaving tomorrow.  Pt used urinal instead and was encouraged to ring bell if he needed to use the bathroom again at any time.

## 2019-06-08 NOTE — ED Notes (Signed)
Provided pt with a diet ginger ale and a pillow, helped pt reposition in bed placed pillows under his back

## 2019-06-08 NOTE — ED Provider Notes (Addendum)
-----------------------------------------   4:13 PM on 06/08/2019 -----------------------------------------  Patient symptoms and work-up discussed with Dr. Jimmye Norman of [facility] medicine regarding possible need for hospitalization due to his high risk for complications from his acute GI syndrome and COVID positive status.  After reviewing vital signs and labs and symptoms, she notes that [facility] is currently running at capacity and they are unfortunately unable to accept him.  She feels that it would be okay for him to receive care locally, so per [facility] pandemic plan I will contact [facility] for hospitalist admission given that [facility] cannot appropriately care for this patient.  Informed Dr. Jimmye Norman of this to which she understands and reiterates that the important thing is that the patient is able to receive his medicines while having his symptoms controlled until he improves again.   Carrie Mew, MD 06/08/19 1616   ----------------------------------------- 5:51 PM on 06/08/2019 -----------------------------------------  Update on clinical status is that patient is essentially back to normal.  Vital signs are normal.  He is eating and drinking orally.  He has unfortunately not had any of his medicines so far today there is no reason why he cannot take them by mouth.  His main needs right now are that he is bedbound and his supports at home are unable to provide the care that he needs to he needs skilled nursing placement.  He is COVID positive which complicates search for a bed.  I will touch base with social work.  He is medically stable at present time with normal vital signs, normal respiratory function, normal liver function.   Carrie Mew, MD 06/08/19 1752    ----------------------------------------- 9:06 PM on 06/08/2019 -----------------------------------------  Discussed with Dr. Sarajane Jews at [facility] regarding the patient's clinical condition and progress  and findings on work-up.  In consideration of the inability to safely discharge the patient and his height needs with assistance with ADLs until SNF placement can be found, Dr. Sarajane Jews has graciously agreed to accept the patient for transfer within the [facility] system to [facility] for ongoing care to ensure that symptoms are controlled and patient can continue taking his medications and remain hydrated and avoid any complications from his liver transplant history such as rejection.  Very much appreciate his team's assistance with this patient's care.  Final diagnoses:  Diarrhea, unspecified type  Generalized abdominal pain  COVID-19 virus infection      Carrie Mew, MD 06/08/19 2109

## 2019-06-09 ENCOUNTER — Encounter (HOSPITAL_COMMUNITY): Payer: Self-pay | Admitting: Family Medicine

## 2019-06-09 ENCOUNTER — Inpatient Hospital Stay (HOSPITAL_COMMUNITY)
Admission: AD | Admit: 2019-06-09 | Discharge: 2019-06-20 | DRG: 178 | Disposition: A | Discharge status: Skilled Nursing Facility | Payer: Medicaid Other | Source: Other Acute Inpatient Hospital | Attending: Family Medicine | Admitting: Family Medicine

## 2019-06-09 ENCOUNTER — Observation Stay (HOSPITAL_COMMUNITY): Payer: Medicaid Other

## 2019-06-09 DIAGNOSIS — I69051 Hemiplegia and hemiparesis following nontraumatic subarachnoid hemorrhage affecting right dominant side: Secondary | ICD-10-CM

## 2019-06-09 DIAGNOSIS — Z79899 Other long term (current) drug therapy: Secondary | ICD-10-CM

## 2019-06-09 DIAGNOSIS — I10 Essential (primary) hypertension: Secondary | ICD-10-CM | POA: Diagnosis present

## 2019-06-09 DIAGNOSIS — K529 Noninfective gastroenteritis and colitis, unspecified: Secondary | ICD-10-CM | POA: Diagnosis not present

## 2019-06-09 DIAGNOSIS — B1910 Unspecified viral hepatitis B without hepatic coma: Secondary | ICD-10-CM

## 2019-06-09 DIAGNOSIS — Z833 Family history of diabetes mellitus: Secondary | ICD-10-CM | POA: Diagnosis not present

## 2019-06-09 DIAGNOSIS — Z8615 Personal history of latent tuberculosis infection: Secondary | ICD-10-CM | POA: Diagnosis not present

## 2019-06-09 DIAGNOSIS — E860 Dehydration: Secondary | ICD-10-CM | POA: Diagnosis not present

## 2019-06-09 DIAGNOSIS — S069X9A Unspecified intracranial injury with loss of consciousness of unspecified duration, initial encounter: Secondary | ICD-10-CM

## 2019-06-09 DIAGNOSIS — Z7982 Long term (current) use of aspirin: Secondary | ICD-10-CM | POA: Diagnosis not present

## 2019-06-09 DIAGNOSIS — N179 Acute kidney failure, unspecified: Secondary | ICD-10-CM | POA: Diagnosis not present

## 2019-06-09 DIAGNOSIS — S069XAA Unspecified intracranial injury with loss of consciousness status unknown, initial encounter: Secondary | ICD-10-CM

## 2019-06-09 DIAGNOSIS — Z8619 Personal history of other infectious and parasitic diseases: Secondary | ICD-10-CM

## 2019-06-09 DIAGNOSIS — Z944 Liver transplant status: Secondary | ICD-10-CM

## 2019-06-09 DIAGNOSIS — Z87820 Personal history of traumatic brain injury: Secondary | ICD-10-CM

## 2019-06-09 DIAGNOSIS — Z7951 Long term (current) use of inhaled steroids: Secondary | ICD-10-CM | POA: Diagnosis not present

## 2019-06-09 DIAGNOSIS — Z931 Gastrostomy status: Secondary | ICD-10-CM | POA: Diagnosis not present

## 2019-06-09 DIAGNOSIS — U071 COVID-19: Secondary | ICD-10-CM | POA: Diagnosis present

## 2019-06-09 DIAGNOSIS — E119 Type 2 diabetes mellitus without complications: Secondary | ICD-10-CM

## 2019-06-09 DIAGNOSIS — Z794 Long term (current) use of insulin: Secondary | ICD-10-CM

## 2019-06-09 DIAGNOSIS — Z7401 Bed confinement status: Secondary | ICD-10-CM

## 2019-06-09 LAB — CBC WITH DIFFERENTIAL/PLATELET
Abs Immature Granulocytes: 0.02 10*3/uL (ref 0.00–0.07)
Basophils Absolute: 0.1 10*3/uL (ref 0.0–0.1)
Basophils Relative: 1 %
Eosinophils Absolute: 0.9 10*3/uL — ABNORMAL HIGH (ref 0.0–0.5)
Eosinophils Relative: 12 %
HCT: 32.3 % — ABNORMAL LOW (ref 39.0–52.0)
Hemoglobin: 10.6 g/dL — ABNORMAL LOW (ref 13.0–17.0)
Immature Granulocytes: 0 %
Lymphocytes Relative: 30 %
Lymphs Abs: 2.1 10*3/uL (ref 0.7–4.0)
MCH: 28.8 pg (ref 26.0–34.0)
MCHC: 32.8 g/dL (ref 30.0–36.0)
MCV: 87.8 fL (ref 80.0–100.0)
Monocytes Absolute: 1 10*3/uL (ref 0.1–1.0)
Monocytes Relative: 15 %
Neutro Abs: 2.9 10*3/uL (ref 1.7–7.7)
Neutrophils Relative %: 42 %
Platelets: 238 10*3/uL (ref 150–400)
RBC: 3.68 MIL/uL — ABNORMAL LOW (ref 4.22–5.81)
RDW: 15.1 % (ref 11.5–15.5)
WBC: 7 10*3/uL (ref 4.0–10.5)
nRBC: 0 % (ref 0.0–0.2)

## 2019-06-09 LAB — COMPREHENSIVE METABOLIC PANEL
ALT: 17 U/L (ref 0–44)
AST: 15 U/L (ref 15–41)
Albumin: 4.2 g/dL (ref 3.5–5.0)
Alkaline Phosphatase: 92 U/L (ref 38–126)
Anion gap: 10 (ref 5–15)
BUN: 22 mg/dL — ABNORMAL HIGH (ref 6–20)
CO2: 21 mmol/L — ABNORMAL LOW (ref 22–32)
Calcium: 9.3 mg/dL (ref 8.9–10.3)
Chloride: 106 mmol/L (ref 98–111)
Creatinine, Ser: 1.19 mg/dL (ref 0.61–1.24)
GFR calc Af Amer: >60 mL/min (ref >60)
GFR calc non Af Amer: >60 mL/min (ref >60)
Glucose, Bld: 81 mg/dL (ref 70–99)
Potassium: 4 mmol/L (ref 3.5–5.1)
Sodium: 137 mmol/L (ref 135–145)
Total Bilirubin: 0.8 mg/dL (ref 0.3–1.2)
Total Protein: 7.7 g/dL (ref 6.5–8.1)

## 2019-06-09 LAB — C-REACTIVE PROTEIN: CRP: 0.8 mg/dL (ref <1.0)

## 2019-06-09 LAB — FERRITIN: Ferritin: 151 ng/mL (ref 24–336)

## 2019-06-09 LAB — ABO/RH: ABO/RH(D): AB POS

## 2019-06-09 LAB — D-DIMER, QUANTITATIVE: D-Dimer, Quant: 0.41 ug/mL-FEU (ref 0.00–0.50)

## 2019-06-09 LAB — GLUCOSE, CAPILLARY
Glucose-Capillary: 77 mg/dL (ref 70–99)
Glucose-Capillary: 109 mg/dL — ABNORMAL HIGH (ref 70–99)
Glucose-Capillary: 111 mg/dL — ABNORMAL HIGH (ref 70–99)
Glucose-Capillary: 144 mg/dL — ABNORMAL HIGH (ref 70–99)

## 2019-06-09 LAB — HIV ANTIBODY (ROUTINE TESTING W REFLEX): HIV Screen 4th Generation wRfx: NONREACTIVE

## 2019-06-09 LAB — LACTATE DEHYDROGENASE: LDH: 112 U/L (ref 98–192)

## 2019-06-09 MED ORDER — INSULIN ASPART 100 UNIT/ML ~~LOC~~ SOLN
0.0000 [IU] | Freq: Three times a day (TID) | SUBCUTANEOUS | Status: DC
  Administered 2019-06-10 – 2019-06-12 (×5): 1 [IU] via SUBCUTANEOUS
  Administered 2019-06-13: 3 [IU] via SUBCUTANEOUS
  Administered 2019-06-13: 2 [IU] via SUBCUTANEOUS
  Administered 2019-06-14: 1 [IU] via SUBCUTANEOUS
  Administered 2019-06-14: 3 [IU] via SUBCUTANEOUS
  Administered 2019-06-15 (×2): 1 [IU] via SUBCUTANEOUS
  Administered 2019-06-16: 2 [IU] via SUBCUTANEOUS
  Administered 2019-06-17 – 2019-06-20 (×3): 1 [IU] via SUBCUTANEOUS

## 2019-06-09 MED ORDER — GABAPENTIN 100 MG PO CAPS
100.0000 mg | ORAL_CAPSULE | Freq: Three times a day (TID) | ORAL | Status: DC
  Administered 2019-06-09 – 2019-06-20 (×34): 100 mg via ORAL
  Filled 2019-06-09 (×34): qty 1

## 2019-06-09 MED ORDER — ISOSORBIDE DINITRATE 20 MG PO TABS
20.0000 mg | ORAL_TABLET | Freq: Three times a day (TID) | ORAL | Status: DC
  Administered 2019-06-09 – 2019-06-20 (×34): 20 mg via ORAL
  Filled 2019-06-09 (×38): qty 1

## 2019-06-09 MED ORDER — ASPIRIN EC 81 MG PO TBEC
81.0000 mg | DELAYED_RELEASE_TABLET | Freq: Every day | ORAL | Status: DC
  Administered 2019-06-09 – 2019-06-20 (×12): 81 mg via ORAL
  Filled 2019-06-09 (×12): qty 1

## 2019-06-09 MED ORDER — FLUTICASONE PROPIONATE 50 MCG/ACT NA SUSP
2.0000 | Freq: Every day | NASAL | Status: DC
  Administered 2019-06-09 – 2019-06-20 (×9): 2 via NASAL
  Filled 2019-06-09: qty 16

## 2019-06-09 MED ORDER — ENTECAVIR 1 MG PO TABS: 1.0000 mg | ORAL_TABLET | Freq: Every day | ORAL | Status: DC

## 2019-06-09 MED ORDER — TACROLIMUS 1 MG PO CAPS
4.0000 mg | ORAL_CAPSULE | Freq: Two times a day (BID) | ORAL | Status: DC
  Administered 2019-06-09 – 2019-06-20 (×23): 4 mg via ORAL
  Filled 2019-06-09 (×25): qty 4

## 2019-06-09 MED ORDER — AMLODIPINE BESYLATE 5 MG PO TABS
10.0000 mg | ORAL_TABLET | Freq: Every day | ORAL | Status: DC
  Administered 2019-06-09 – 2019-06-20 (×12): 10 mg via ORAL
  Filled 2019-06-09 (×11): qty 2

## 2019-06-09 MED ORDER — PANTOPRAZOLE SODIUM 40 MG PO TBEC
40.0000 mg | DELAYED_RELEASE_TABLET | Freq: Every day | ORAL | Status: DC
  Administered 2019-06-09 – 2019-06-20 (×12): 40 mg via ORAL
  Filled 2019-06-09 (×12): qty 1

## 2019-06-09 MED ORDER — INSULIN ASPART 100 UNIT/ML ~~LOC~~ SOLN
0.0000 [IU] | Freq: Every day | SUBCUTANEOUS | Status: DC
  Administered 2019-06-10 – 2019-06-11 (×2): 2 [IU] via SUBCUTANEOUS

## 2019-06-09 MED ORDER — SODIUM CHLORIDE 0.9% FLUSH
3.0000 mL | Freq: Two times a day (BID) | INTRAVENOUS | Status: DC
  Administered 2019-06-09 – 2019-06-19 (×22): 3 mL via INTRAVENOUS
  Administered 2019-06-20: 10 mL via INTRAVENOUS

## 2019-06-09 MED ORDER — ENOXAPARIN SODIUM 40 MG/0.4ML ~~LOC~~ SOLN
40.0000 mg | SUBCUTANEOUS | Status: DC
  Administered 2019-06-09 – 2019-06-20 (×12): 40 mg via SUBCUTANEOUS
  Filled 2019-06-09 (×11): qty 0.4

## 2019-06-09 MED ORDER — LOSARTAN POTASSIUM 25 MG PO TABS
25.0000 mg | ORAL_TABLET | Freq: Every day | ORAL | Status: DC
  Administered 2019-06-09 – 2019-06-20 (×12): 25 mg via ORAL
  Filled 2019-06-09 (×13): qty 1

## 2019-06-09 NOTE — ED Notes (Signed)
PT used urinal. Cleansed of small amount of incontinent urine and new brief applied.

## 2019-06-09 NOTE — Progress Notes (Signed)
Dr. Sarajane Jews called upon pts arrival to the unit. MD to round and see pt.

## 2019-06-09 NOTE — Evaluation (Signed)
Physical Therapy Evaluation Patient Details Name: Terry Ibarra MRN: [id number] DOB: 10-Jun-1966 Today's Date: 06/09/2019   History of Present Illness  Pt adm after slid out of recliner at home and subsequently found to be Covid + (asymptomatic. Pt had been at brother's home since dc from SNF 1 week earlier after TBI in february. Brother unable to provide care. 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 Pt very motivated to work with therapy to improve functional status. Pt reports he only got 2 days of rehab at [facility]. Feel pt could benefit from more therapy to improve function and reduce burden of care.     Follow Up Recommendations SNF    Equipment Recommendations  Wheelchair (measurements PT);Wheelchair cushion (measurements PT)    Recommendations for Other Services OT consult     Precautions / Restrictions Precautions Precautions: Fall Restrictions Weight Bearing Restrictions: No      Mobility  Bed Mobility Overal bed mobility: Needs Assistance Bed Mobility: Rolling;Supine to Sit;Sit to Sidelying Rolling: Min guard;Mod assist(min guard toward rt, mod assist to lt)   Supine to sit: Max assist   Sit to sidelying: Mod assist General bed mobility comments: Assist to bring legs off of bed, elevate trunk into sitting, and bring hips to EOB. Assist to lower trunk and bring legs back up into bed.  Transfers                 General transfer comment: not attempted. Pt has been mechanical lift prior to admit  Ambulation/Gait                Stairs            Wheelchair Mobility    Modified Rankin (Stroke Patients Only)       Balance  Overall balance assessment: Needs assistance Sitting-balance support: Bilateral upper extremity supported;Feet supported Sitting balance-Leahy Scale: Poor Sitting balance - Comments: Sat EOB x 8 minutes with min to mod assist  Postural control: Posterior lean;Right lateral lean                                   Pertinent Vitals/Pain Pain Assessment: No/denies pain    Home Living Family/patient expects to be discharged to:: Skilled nursing facility                      Prior Function Level of [place]: Needs assistance   Gait / Transfers Assistance Needed: mechanical lift for OOB  ADL's / Homemaking Assistance Needed: able to self feed with adaptive equipment, assist for other ADL's        Hand Dominance   Dominant Hand: Right    Extremity/Trunk Assessment   Upper Extremity Assessment Upper Extremity Assessment: Defer to OT evaluation    Lower Extremity Assessment Lower Extremity Assessment: RLE deficits/detail;LLE deficits/detail RLE Deficits / Details: strength grossly 2+/5 RLE Coordination: decreased gross motor LLE Deficits / Details: strength grossly 2+/5 LLE Coordination: decreased gross motor       Communication   Communication: No difficulties  Cognition Arousal/Alertness: Awake/alert Behavior During Therapy: WFL for tasks assessed/performed Overall Cognitive Status: No family/caregiver present to determine baseline cognitive functioning  General Comments: Pt following all commands, answers questions appropriately, able to give history. Did not know why he was at the hospital      General Comments      Exercises General Exercises - Lower Extremity Hip ABduction/ADduction: Both;5 reps;Strengthening;Supine(in hooklying) Other Exercises Other Exercises: In sitting leaning to side propping on forearm and pushing back up   Assessment/Plan    PT Assessment Patient needs continued PT  services  PT Problem List Decreased strength;Decreased balance;Decreased mobility;Decreased coordination       PT Treatment Interventions DME instruction;Functional mobility training;Therapeutic activities;Therapeutic exercise;Balance training;Neuromuscular re-education;Patient/family education    PT Goals (Current goals can be found in the Care Plan section)  Acute Rehab PT Goals Patient Stated Goal: pt wants to walk PT Goal Formulation: With patient Time For Goal Achievement: 06/23/19 Potential to Achieve Goals: Good    Frequency Min 3X/week   Barriers to discharge        Co-evaluation               AM-PAC PT "6 Clicks" Mobility  Outcome Measure Help needed turning from your back to your side while in a flat bed without using bedrails?: A Little Help needed moving from lying on your back to sitting on the side of a flat bed without using bedrails?: A Lot Help needed moving to and from a bed to a chair (including a wheelchair)?: Total Help needed standing up from a chair using your arms (e.g., wheelchair or bedside chair)?: Total Help needed to walk in hospital room?: Total Help needed climbing 3-5 steps with a railing? : Total 6 Click Score: 9    End of Session   Activity Tolerance: Patient tolerated treatment well Patient left: in bed;with call bell/phone within reach Nurse Communication: Mobility status;Need for lift equipment PT Visit Diagnosis: Other abnormalities of gait and mobility (R26.89);Other symptoms and signs involving the nervous system (R29.898)    Time: [id number] PT Time Calculation (min) (ACUTE ONLY): 17 min   Charges:   PT Evaluation $PT Eval Moderate Complexity: 1 Mod          [facility] Center[facility] Yarielis Funaro PT Acute Rehabilitation Services Pager [phone] Office [phone]   Angelina OkCary W [facility] CenterMaycok 06/09/2019, 10:27 AM
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
## 2019-06-09 NOTE — ED Notes (Signed)
I have reviewed the EMTALA and it is complete at this time.  

## 2019-06-10 ENCOUNTER — Other Ambulatory Visit: Payer: Self-pay

## 2019-06-10 DIAGNOSIS — U071 COVID-19: Secondary | ICD-10-CM | POA: Diagnosis not present

## 2019-06-10 LAB — GLUCOSE, CAPILLARY
Glucose-Capillary: 127 mg/dL — ABNORMAL HIGH (ref 70–99)
Glucose-Capillary: 132 mg/dL — ABNORMAL HIGH (ref 70–99)
Glucose-Capillary: 150 mg/dL — ABNORMAL HIGH (ref 70–99)

## 2019-06-10 NOTE — [place] FL2 (Signed)
[place] MEDICAID FL2 LEVEL OF CARE SCREENING TOOL     IDENTIFICATION  Patient Name: Newman NickelsJohn Matthew Khurana Birthdate: [birth year] Sex: male Admission Date (Current Location): 06/09/2019  [facility] [place]   Facility and Address:  [facility] [place], [address], [place], [place] [address](BJYNW [id number]([place])      Provider Number: [id number]  Attending Physician Name and Address:  Leroy SeaSingh, Prashant K, MD  Relative Name and Phone Number:       Current Level of Care: Hospital Recommended Level of Care: Skilled Nursing Facility Prior Approval Number:    Date Approved/Denied:   PASRR Number: [id number] [id number]  Discharge Plan: SNF    Current Diagnoses: Patient Active Problem List   Diagnosis Date Noted  . COVID-19 virus detected 06/09/2019  . Dehydration 06/09/2019  . TBI (traumatic brain injury) (HCC) 06/09/2019  . DM type 2 (diabetes mellitus, type 2) (HCC) 06/09/2019  . Hepatitis B 06/09/2019  . Liver transplanted (HCC) 06/09/2019  . Diarrhea 06/08/2019    Orientation RESPIRATION BLADDER Height & Weight     Self, Time, Situation, Place  Normal Incontinent Weight: 196 lb 3.2 oz (89 kg) Height:  6' (182.9 cm)  BEHAVIORAL SYMPTOMS/MOOD NEUROLOGICAL BOWEL NUTRITION STATUS      Incontinent Diet(see DC summary)  AMBULATORY STATUS COMMUNICATION OF NEEDS Skin   Extensive Assist Verbally Normal                       Personal Care Assistance Level of Assistance  Bathing, Feeding, Dressing Bathing Assistance: Maximum assistance Feeding assistance: Limited assistance Dressing Assistance: Maximum assistance     Functional Limitations Info  Sight, Hearing, Speech Sight Info: Impaired Hearing Info: Adequate Speech Info: Adequate    SPECIAL CARE FACTORS FREQUENCY  PT (By licensed PT), OT (By licensed OT)     PT Frequency: 5x/wk OT Frequency: 5x/wk            Contractures Contractures Info: Not present    Additional Factors  Info  Code Status, Allergies, Insulin Sliding Scale, Isolation Precautions Code Status Info: Full Allergies Info: NKA   Insulin Sliding Scale Info: 0-9 units 3x/day with meals; 0-5 units daily at bed Isolation Precautions Info: Airborne/Contact precautions     Current Medications (06/10/2019):  This is the current hospital active medication list Current Facility-Administered Medications  Medication Dose Route Frequency Provider Last Rate Last Dose  . amLODipine (NORVASC) tablet 10 mg  10 mg Oral Daily Standley BrookingGoodrich, Daniel P, MD   10 mg at 06/10/19 0829  . aspirin EC tablet 81 mg  81 mg Oral Daily Standley BrookingGoodrich, Daniel P, MD   81 mg at 06/10/19 [id number]  . enoxaparin (LOVENOX) injection 40 mg  40 mg Subcutaneous Q24H Leroy SeaSingh, Prashant K, MD   40 mg at 06/10/19 0832  . entecavir (BARACLUDE) tablet 1 mg  1 mg Oral Daily Standley BrookingGoodrich, Daniel P, MD      . fluticasone [facility](FLONASE) 50 MCG/ACT nasal spray 2 spray  2 spray Each Nare Daily Standley BrookingGoodrich, Daniel P, MD   2 spray at 06/10/19 [phone]  . gabapentin (NEURONTIN) capsule 100 mg  100 mg Oral TID Standley BrookingGoodrich, Daniel P, MD   100 mg at 06/10/19 0830  . insulin aspart (novoLOG) injection 0-5 Units  0-5 Units Subcutaneous QHS Standley BrookingGoodrich, Daniel P, MD      . insulin aspart (novoLOG) injection 0-9 Units  0-9 Units Subcutaneous TID WC Standley BrookingGoodrich, Daniel P, MD   1 Units at 06/10/19 [phone]  .  isosorbide dinitrate (ISORDIL) tablet 20 mg  20 mg Oral TID Samuella Cota, MD   20 mg at 06/10/19 0831  . losartan (COZAAR) tablet 25 mg  25 mg Oral Daily Samuella Cota, MD   25 mg at 06/10/19 0831  . pantoprazole (PROTONIX) EC tablet 40 mg  40 mg Oral Daily Samuella Cota, MD   40 mg at 06/10/19 0830  . sodium chloride flush (NS) 0.9 % injection 3 mL  3 mL Intravenous Q12H Samuella Cota, MD   3 mL at 06/10/19 0833  . tacrolimus (PROGRAF) capsule 4 mg  4 mg Oral BID Samuella Cota, MD   4 mg at 06/10/19 2094     Discharge Medications: Please see discharge summary for a list of  discharge medications.  Relevant Imaging Results:  Relevant Lab Results:   Additional Information SS#: [id number]  Geralynn Ochs, LCSW
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
## 2019-06-10 NOTE — Plan of Care (Signed)
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
## 2019-06-11 DIAGNOSIS — Z833 Family history of diabetes mellitus: Secondary | ICD-10-CM | POA: Diagnosis not present

## 2019-06-11 DIAGNOSIS — Z8619 Personal history of other infectious and parasitic diseases: Secondary | ICD-10-CM | POA: Diagnosis not present

## 2019-06-11 DIAGNOSIS — Z794 Long term (current) use of insulin: Secondary | ICD-10-CM | POA: Diagnosis not present

## 2019-06-11 DIAGNOSIS — E119 Type 2 diabetes mellitus without complications: Secondary | ICD-10-CM | POA: Diagnosis present

## 2019-06-11 DIAGNOSIS — Z7401 Bed confinement status: Secondary | ICD-10-CM | POA: Diagnosis not present

## 2019-06-11 DIAGNOSIS — Z7982 Long term (current) use of aspirin: Secondary | ICD-10-CM | POA: Diagnosis not present

## 2019-06-11 DIAGNOSIS — Z87820 Personal history of traumatic brain injury: Secondary | ICD-10-CM | POA: Diagnosis not present

## 2019-06-11 DIAGNOSIS — Z8615 Personal history of latent tuberculosis infection: Secondary | ICD-10-CM | POA: Diagnosis not present

## 2019-06-11 DIAGNOSIS — E860 Dehydration: Secondary | ICD-10-CM | POA: Diagnosis present

## 2019-06-11 DIAGNOSIS — Z931 Gastrostomy status: Secondary | ICD-10-CM | POA: Diagnosis not present

## 2019-06-11 DIAGNOSIS — K529 Noninfective gastroenteritis and colitis, unspecified: Secondary | ICD-10-CM | POA: Diagnosis present

## 2019-06-11 DIAGNOSIS — U071 COVID-19: Secondary | ICD-10-CM | POA: Diagnosis present

## 2019-06-11 DIAGNOSIS — N179 Acute kidney failure, unspecified: Secondary | ICD-10-CM | POA: Diagnosis present

## 2019-06-11 DIAGNOSIS — I10 Essential (primary) hypertension: Secondary | ICD-10-CM | POA: Diagnosis present

## 2019-06-11 DIAGNOSIS — B1910 Unspecified viral hepatitis B without hepatic coma: Secondary | ICD-10-CM | POA: Diagnosis not present

## 2019-06-11 DIAGNOSIS — Z944 Liver transplant status: Secondary | ICD-10-CM | POA: Diagnosis not present

## 2019-06-11 DIAGNOSIS — I69051 Hemiplegia and hemiparesis following nontraumatic subarachnoid hemorrhage affecting right dominant side: Secondary | ICD-10-CM | POA: Diagnosis not present

## 2019-06-11 DIAGNOSIS — Z7951 Long term (current) use of inhaled steroids: Secondary | ICD-10-CM | POA: Diagnosis not present

## 2019-06-11 DIAGNOSIS — Z79899 Other long term (current) drug therapy: Secondary | ICD-10-CM | POA: Diagnosis not present

## 2019-06-11 LAB — BASIC METABOLIC PANEL
Anion gap: 8 (ref 5–15)
BUN: 25 mg/dL — ABNORMAL HIGH (ref 6–20)
CO2: 21 mmol/L — ABNORMAL LOW (ref 22–32)
Calcium: 9 mg/dL (ref 8.9–10.3)
Chloride: 108 mmol/L (ref 98–111)
Creatinine, Ser: 1.13 mg/dL (ref 0.61–1.24)
GFR calc Af Amer: >60 mL/min (ref >60)
GFR calc non Af Amer: >60 mL/min (ref >60)
Glucose, Bld: 86 mg/dL (ref 70–99)
Potassium: 4.8 mmol/L (ref 3.5–5.1)
Sodium: 137 mmol/L (ref 135–145)

## 2019-06-11 LAB — GLUCOSE, CAPILLARY
Glucose-Capillary: 206 mg/dL — ABNORMAL HIGH (ref 70–99)
Glucose-Capillary: 95 mg/dL (ref 70–99)
Glucose-Capillary: 126 mg/dL — ABNORMAL HIGH (ref 70–99)
Glucose-Capillary: 114 mg/dL — ABNORMAL HIGH (ref 70–99)

## 2019-06-11 LAB — MAGNESIUM: Magnesium: 1.4 mg/dL — ABNORMAL LOW (ref 1.7–2.4)

## 2019-06-11 MED ORDER — DICLOFENAC SODIUM 1 % TD GEL
2.0000 g | Freq: Four times a day (QID) | TRANSDERMAL | Status: DC
  Administered 2019-06-11 – 2019-06-20 (×31): 2 g via TOPICAL
  Filled 2019-06-11: qty 100

## 2019-06-11 MED ORDER — MAGNESIUM SULFATE 2 GM/50ML IV SOLN
2.0000 g | Freq: Once | INTRAVENOUS | Status: AC
  Administered 2019-06-11: 2 g via INTRAVENOUS
  Filled 2019-06-11: qty 50
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
## 2019-06-11 NOTE — Progress Notes (Signed)
Rehab Admissions Coordinator Note:  Patient was screened by [name] for appropriateness for an Inpatient Acute Rehab Consult.  At this time, we are recommending Inpatient Rehab consult. AC will contact MD to request consult order.   [name] 06/11/2019, 6:07 PM  I can be reached at [phone].

## 2019-06-12 LAB — MAGNESIUM: Magnesium: 1.7 mg/dL (ref 1.7–2.4)

## 2019-06-12 LAB — GLUCOSE, CAPILLARY
Glucose-Capillary: 212 mg/dL — ABNORMAL HIGH (ref 70–99)
Glucose-Capillary: 103 mg/dL — ABNORMAL HIGH (ref 70–99)
Glucose-Capillary: 138 mg/dL — ABNORMAL HIGH (ref 70–99)
Glucose-Capillary: 132 mg/dL — ABNORMAL HIGH (ref 70–99)
Glucose-Capillary: 132 mg/dL — ABNORMAL HIGH (ref 70–99)
Glucose-Capillary: 192 mg/dL — ABNORMAL HIGH (ref 70–99)

## 2019-06-12 NOTE — Progress Notes (Signed)
Inpatient Rehabilitation-Admissions Coordinator   Spoke with Dr. Candiss Norse regarding disposition plans. Per Dr. Candiss Norse, pt is medically ready for DC and he does not feel that this pt will have a negative test any time soon. Per current CIR protocol, pt will need to have two negative tests (if previously COVID+) prior to being admitted to CIR. Thus, it appears pt will need to be accepted to a facility that accepts positive patients as he is ready to DC from the hospital.   [facility] will follow at a distance if disposition to SNF falls through.   Please call if questions.   [name], OTR/L  Rehab Admissions Coordinator  [phone] 06/12/2019 9:26 AM
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
## 2019-06-12 NOTE — Plan of Care (Signed)

## 2019-06-12 NOTE — TOC Initial Note (Signed)
Transition of Care [facility](TOC) - Initial/Assessment Note    Patient Details  Name: Terry Ibarra MRN: [id number] Date of Birth: [birth year]  Transition of Care [facility](TOC) CM/SW Contact:    Gildardo GriffesAshley M Hilda [place]xler, [place] Phone Number: [phone] 06/12/2019, 3:27 PM  Clinical Narrative:                  CSW spoke to patient's POA Capria who reports she is hesitant about patient going to SNF due to past negative experiences. CSW informed of limited options due to patient testing positive for COVID, she reports she is not happy with review for [facility] [place] [address] [place] [address] [place]. CSW informed that is only bed offers at this time. Maryelizabeth KaufmannCapria reports she will talk with family and discuss with CSW tomorrow morning. She inquired if patient can stay here until he tests negative, CSW informed that can take up to 30 days and patient is currently ready to discharge.   Expected Discharge Plan: Skilled Nursing Facility Barriers to Discharge: SNF Pending bed offer   Patient Goals and CMS Choice   CMS Medicare.gov Compare Post Acute Care list provided to:: Patient Represenative (must comment)(Capria (POA)) Choice offered to / list presented to : [facility] POA / Guardian(Capria)  Expected Discharge Plan and Services Expected Discharge Plan: Skilled Nursing Facility     Post Acute Care Choice: Skilled Nursing Facility Living arrangements for the past 2 months: Single Family Home                                      Prior Living Arrangements/Services Living arrangements for the past 2 months: Single Family Home Lives with:: Self Patient language and need for interpreter reviewed:: Yes        Need for Family Participation in Patient Care: Yes (Comment) Care giver support system in place?: Yes (comment)   Criminal Activity/Legal Involvement Pertinent to Current Situation/Hospitalization: No - Comment as needed  Activities of Daily Living Home Assistive Devices/Equipment: Wheelchair ADL Screening  (condition at time of admission) Patient's cognitive ability adequate to safely complete daily activities?: No Is the patient deaf or have difficulty hearing?: No Does the patient have difficulty seeing, even when wearing glasses/contacts?: No Does the patient have difficulty concentrating, remembering, or making decisions?: Yes Patient able to express need for assistance with ADLs?: Yes Does the patient have difficulty dressing or bathing?: Yes Independently performs ADLs?: No Communication: Independent Dressing (OT): Dependent Is this a change from baseline?: Pre-admission baseline Grooming: Dependent Is this a change from baseline?: Pre-admission baseline Feeding: Dependent Is this a change from baseline?: Pre-admission baseline Bathing: Dependent Is this a change from baseline?: Pre-admission baseline Toileting: Dependent Is this a change from baseline?: Pre-admission baseline In/Out Bed: Dependent Is this a change from baseline?: Pre-admission baseline Walks in Home: Dependent Is this a change from baseline?: Pre-admission baseline Does the patient have difficulty walking or climbing stairs?: Yes Weakness of Legs: Both Weakness of Arms/Hands: Both  Permission Sought/Granted Permission sought to share information with : Case Manager, Family Supports, Oceanographeracility Contact Representative Permission granted to share information with : Yes, Verbal Permission Granted  Share Information with NAME: Capria  Permission granted to share info w AGENCY: SNFs  Permission granted to share info w Relationship: POA  Permission granted to share info w Contact Information: [phone]  Emotional Assessment Appearance:: Other (Comment Required(unable to assess - remote) Attitude/Demeanor/Rapport: Unable to Assess Affect (typically  observed): Unable to Assess Orientation: : Oriented to Self, Oriented to Place, Oriented to Situation Alcohol / Substance Use: Not Applicable Psych Involvement: No  (comment)  Admission diagnosis:  COVID-19 virus infection [U07.1] Patient Active Problem List   Diagnosis Date Noted  . COVID-19 virus detected 06/09/2019  . Dehydration 06/09/2019  . TBI (traumatic brain injury) ([place]) 06/09/2019  . DM type 2 (diabetes mellitus, type 2) ([place]) 06/09/2019  . Hepatitis B 06/09/2019  . Liver transplanted ([place]) 06/09/2019  . Diarrhea 06/08/2019   PCP:  Aldean Jewett, MD Pharmacy:   [facility] [address] ([address]), [place]) [place] [address] Phone: [phone] Fax: [phone]     Social Determinants of Health (SDOH) Interventions    Readmission Risk Interventions No flowsheet data found.

## 2019-06-13 LAB — GLUCOSE, CAPILLARY
Glucose-Capillary: 99 mg/dL (ref 70–99)
Glucose-Capillary: 154 mg/dL — ABNORMAL HIGH (ref 70–99)
Glucose-Capillary: 201 mg/dL — ABNORMAL HIGH (ref 70–99)
Glucose-Capillary: 164 mg/dL — ABNORMAL HIGH (ref 70–99)

## 2019-06-13 MED ORDER — ATORVASTATIN CALCIUM 40 MG PO TABS
40.0000 mg | ORAL_TABLET | Freq: Every day | ORAL | Status: DC
  Administered 2019-06-13 – 2019-06-19 (×7): 40 mg via ORAL
  Filled 2019-06-13 (×7): qty 1

## 2019-06-13 MED ORDER — LABETALOL HCL 100 MG PO TABS
200.0000 mg | ORAL_TABLET | Freq: Three times a day (TID) | ORAL | Status: DC
  Administered 2019-06-13 – 2019-06-20 (×20): 200 mg via ORAL
  Filled 2019-06-13 (×21): qty 2

## 2019-06-13 NOTE — Progress Notes (Signed)
Inpatient Rehabilitation Admissions Coordinator  Inpatient rehab consult received. Patient previously at SNF since 01/2019 and then removed from SNF to go home with his brother. Brother can not provide 24/7 assist and patient is looking for long term care. I contacted SW, Caryl Pina, by phone and she states she has a long term SNF bed available that can take COVID patients. Pt not a candidate for an inpt rehab admit at this time for he will need some caregiver support at d/c , which he does not have . He also remains COVID positive as of 5 days ago. Does not meet the current CDC guidelines for admission to our inpt rehab . Please call me with any questions.  Danne Baxter, RN, MSN Rehab Admissions Coordinator [phone] 06/13/2019 12:49 PM

## 2019-06-13 NOTE — Progress Notes (Addendum)
[facility] PROGRESS NOTE    Progress Note  [name]  QBH:[id number] DOB: 1966/09/25 DOA: 06/09/2019 PCP: Aldean Jewett, MD     Brief Narrative:   Terry Ibarra is an 53 y.o. 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  He was a skilled nursing facility where he started having diarrhea, seen at [facility] and tested positive for COVID-19 infection  Assessment/Plan:   Acute COVID-19 virus detected with significant watery diarrhea: Being treated supportively with IV fluids and repletion of electrolytes. Seems Euvolemic on physical exam. Lungs seem to be clear she is satting greater than 93% on room air. Consult physical therapy and social worker for possible skilled skilled nursing facility placement tomorrow morning. Patient relates his diarrhea has resolved.  Central melanosis/chronic right-sided weakness: Continue physical therapy and feeding through PEG tube.  History of hepatitis B/with liver transplant.: Continue Prograf.  Acute kidney injury: Likely prerenal due to diarrhea resolved with IV fluid hydration.  Hypomagnesemia: Repleted orally.  Diabetes mellitus type 2: Continue sliding scale insulin.    DVT prophylaxis: lovenxo Family Communication:none Disposition Plan/Barrier to D/C: SNF in am Code Status:     Code Status Orders  (From admission, onward)         Start     Ordered   06/09/19 0530  Full code  Continuous     06/09/19 0532        Code Status History    This patient has a current code status but no historical code status.   Advance Care Planning Activity        IV Access:    Peripheral IV   Procedures  and diagnostic studies:   No results found.   Medical Consultants:    None.  Anti-Infectives:    Subjective:    Elinor Dodge his diarrhea has resolved feels slightly much better than yesterday.  Objective:    Vitals:   06/12/19 1900 06/12/19 2315 06/13/19 0500 06/13/19 0733  BP:  112/73 111/80   Pulse:  80 84   Resp:  20 19 18   Temp: 97.7 F (36.5 C) 98.4 F (36.9 C) 97.7 F (36.5 C) 98.2 F (36.8 C)  TempSrc: Oral Oral Oral Oral  SpO2:      Weight:      Height:        Intake/Output Summary (Last 24 hours) at 06/13/2019 0748 Last data filed at 06/13/2019 0715 Gross per 24 hour  Intake 1383 ml  Output 1050 ml  Net 333 ml   Filed Weights   06/09/19 0335  Weight: 89 kg    Exam: General exam: In no acute distress. Respiratory system: Good air movement and clear to auscultation. Cardiovascular system: S1 & S2 heard, RRR. No JVD.Marland Kitchen  Gastrointestinal system: Abdomen is nondistended, soft and nontender.  Extremities: No pedal edema. Skin: No rashes, lesions or ulcers Psychiatry: Judgement and insight appear normal. Mood & affect appropriate.     Data Reviewed:    Labs: Basic Metabolic Panel: Recent Labs  Lab 06/06/19 1955 06/09/19 0605 06/11/19 0457 06/12/19 0457  NA 136 137 137  --   K 4.7 4.0 4.8  --   CL 106 106 108  --   CO2 19* 21* 21*  --  GLUCOSE 111* 81 86  --   BUN 41* 22* 25*  --   CREATININE 1.62* 1.19 1.13  --   CALCIUM 9.3 9.3 9.0  --   MG  --   --  1.4* 1.7   GFR Estimated Creatinine Clearance: 81.1 mL/min (by C-G formula based on SCr of 1.13 mg/dL). Liver Function Tests: Recent Labs  Lab 06/06/19 1955 06/09/19 0605  AST 19 15  ALT 27 17  ALKPHOS 114 92  BILITOT 1.0 0.8  PROT 8.1 7.7  ALBUMIN 4.3 4.2   Recent Labs  Lab 06/06/19 1955  LIPASE 23   No results for input(s): AMMONIA in the last 168 hours. Coagulation profile No results for input(s): INR, PROTIME in the last 168 hours. COVID-19 Labs  No  results for input(s): DDIMER, FERRITIN, LDH, CRP in the last 72 hours.  Lab Results  Component Value Date   SARSCOV2NAA POSITIVE (A) 06/08/2019    CBC: Recent Labs  Lab 06/06/19 1955 06/09/19 0605  WBC 8.0 7.0  NEUTROABS 4.3 2.9  HGB 11.1* 10.6*  HCT 34.5* 32.3*  MCV 86.9 87.8  PLT 262 238   Cardiac Enzymes: No results for input(s): CKTOTAL, CKMB, CKMBINDEX, TROPONINI in the last 168 hours. BNP (last 3 results) No results for input(s): PROBNP in the last 8760 hours. CBG: Recent Labs  Lab 06/12/19 0818 06/12/19 1206 06/12/19 1240 06/12/19 1647 06/12/19 2003  GLUCAP 103* 138* 132* 132* 192*   D-Dimer: No results for input(s): DDIMER in the last 72 hours. Hgb A1c: No results for input(s): HGBA1C in the last 72 hours. Lipid Profile: No results for input(s): CHOL, HDL, LDLCALC, TRIG, CHOLHDL, LDLDIRECT in the last 72 hours. Thyroid function studies: No results for input(s): TSH, T4TOTAL, T3FREE, THYROIDAB in the last 72 hours.  Invalid input(s): FREET3 Anemia work up: No results for input(s): VITAMINB12, FOLATE, FERRITIN, TIBC, IRON, RETICCTPCT in the last 72 hours. Sepsis Labs: Recent Labs  Lab 06/06/19 1955 06/09/19 0605  WBC 8.0 7.0   Microbiology Recent Results (from the past 240 hour(s))  SARS Coronavirus 2 (CEPHEID - Performed in [facility] hospital lab), Hosp Order     Status: Abnormal   Collection Time: 06/08/19 10:40 AM   Specimen: Nasopharyngeal Swab  Result Value Ref Range Status   SARS Coronavirus 2 POSITIVE (A) NEGATIVE Final    Comment: RESULT CALLED TO, READ BACK BY AND VERIFIED WITH: MEGAN JONES @1154  ON 06/08/2019 BY FMW (NOTE) If result is NEGATIVE SARS-CoV-2 target nucleic acids are NOT DETECTED. The SARS-CoV-2 RNA is generally detectable in upper and lower  respiratory specimens during the acute phase of infection. The lowest  concentration of SARS-CoV-2 viral copies this assay can detect is 250  copies / mL. A negative result does  not preclude SARS-CoV-2 infection  and should not be used as the sole basis for treatment or other  patient management decisions.  A negative result may occur with  improper specimen collection / handling, submission of specimen other  than nasopharyngeal swab, presence of viral mutation(s) within the  areas targeted by this assay, and inadequate number of viral copies  (<250 copies / mL). A negative result must be combined with clinical  observations, patient history, and epidemiological information. If result is POSITIVE SARS-CoV-2 target nucleic acids are DETECTED.  The SARS-CoV-2 RNA is generally detectable in upper and lower  respiratory specimens during the acute phase of infection.  Positive  results are indicative of active infection with SARS-CoV-2.  Clinical  correlation with  patient history and other diagnostic information is  necessary to determine patient infection status.  Positive results do  not rule out bacterial infection or co-infection with other viruses. If result is PRESUMPTIVE POSTIVE SARS-CoV-2 nucleic acids MAY BE PRESENT.   A presumptive positive result was obtained on the submitted specimen  and confirmed on repeat testing.  While 2019 novel coronavirus  (SARS-CoV-2) nucleic acids may be present in the submitted sample  additional confirmatory testing may be necessary for epidemiological  and / or clinical management purposes  to differentiate between  SARS-CoV-2 and other Sarbecovirus currently known to infect humans.  If clinically indicated additional testing with an alternate test  methodology [phone]([id number])  is advised. The SARS-CoV-2 RNA is generally  detectable in upper and lower respiratory specimens during the acute  phase of infection. The expected result is Negative. Fact Sheet for Patients:  BoilerBrush.com.cyhttps://www.fda.gov/media/136312/download Fact Sheet for Healthcare Providers: https://pope.com/https://www.fda.gov/media/136313/download This test is not yet approved or  cleared by the [place] FDA and has been authorized for detection and/or diagnosis of SARS-CoV-2 by FDA under an Emergency Use Authorization (EUA).  This EUA will remain in effect (meaning this test can be used) for the duration of the COVID-19 declaration under Section 564(b)(1) of the Act, 21 U.S.C. section 360bbb-3(b)(1), unless the authorization is terminated or revoked sooner. Performed at [facility], [address] [place], [place] [address]      Medications:   . amLODipine  10 mg Oral Daily  . aspirin EC  81 mg Oral Daily  . diclofenac sodium  2 g Topical QID  . enoxaparin (LOVENOX) injection  40 mg Subcutaneous Q24H  . entecavir  1 mg Oral Daily  . fluticasone  2 spray Each Nare Daily  . gabapentin  100 mg Oral TID  . insulin aspart  0-5 Units Subcutaneous QHS  . insulin aspart  0-9 Units Subcutaneous TID WC  . isosorbide dinitrate  20 mg Oral TID  . losartan  25 mg Oral Daily  . pantoprazole  40 mg Oral Daily  . sodium chloride flush  3 mL Intravenous Q12H  . tacrolimus  4 mg Oral BID   Continuous Infusions:    LOS: 3 days   Marinda ElkAbraham Feliz Ortiz  [facility]  06/13/2019, 7:48 AM

## 2019-06-13 NOTE — Plan of Care (Signed)

## 2019-06-13 NOTE — Progress Notes (Signed)
CSW spoke with patient's POA Terry Ibarra today who reports family has chosen [facility] [facility] as SNF in [place] has reached out to facility, they are determining when a  Bed will be avail and will let CSW know.   [place], [place]

## 2019-06-13 NOTE — Progress Notes (Signed)
Physical Therapy Treatment Patient Details Name: Terry Ibarra MRN: [id number] DOB: 1966-01-20 Today's Date: 06/13/2019    History of Present Illness Pt adm after slid out of recliner at home and subsequently found to be Covid + (asymptomatic. Pt had been at brother's home since dc from SNF 1 week earlier after TBI in february. Brother unable to provide care. PMH - colonic injury, retrobulbar hemorrhage, posterior sixth rib fracture and traumatic subarachnoid hemorrhage, traumatic brain injury with residual weakness 01/2019, hepatitis B, hepatitis C, liver transplant 2017, latent tuberculosis    PT Comments    Pt is very motivated to work with therapy.  He wants [place] to come every day.  He reports, "This is the most therapy I have gotten since my accident".  Pt is improving with every session.  Today he was able to stand EOB in the standing frame, initially with max assist, then from the elevated standing frame seat, with min assist.  He liked having the recliner chair turned backwards in front of him as he felt more secure (felt like he was falling forward) with it there.  I encouraged in bed exercises and mobility hourly.  He is an excellent rehab candidate and would really benefit from the opportunity to do intensive multi D therapy.  PT will continue to follow acutely for safe mobility progression  Follow Up Recommendations  CIR;Supervision/Assistance - 24 hour     Equipment Recommendations  Wheelchair (measurements PT);Wheelchair cushion (measurements PT);3in1 (PT)    Recommendations for Other Services   NA     Precautions / Restrictions Precautions Precautions: Fall Precaution Comments: ataxic Restrictions Weight Bearing Restrictions: No    Mobility  Bed Mobility Overal bed mobility: Needs Assistance Bed Mobility: Rolling;Sidelying to Sit;Sit to Sidelying Rolling: Min assist(to the right) Sidelying to sit: +2 for physical assistance;Mod assist     Sit to sidelying: +2  for physical assistance;Mod assist General bed mobility comments: Pt able to roll with min assit to his right side as he can pull more with his left arm, assist to position in knee flexion to use LEs to help roll and cues for reaching to railing.  Assist needed at trunk to lift up to sitting EOB, minimal assist to help kick legs over EOB separate of trunk.  When returning to sidelying   Transfers Overall transfer level: Needs assistance   Transfers: Sit to/from Stand Sit to Stand: +2 physical assistance;Max assist;Mod assist;From elevated surface;Min assist         General transfer comment: Pt stood multiple times from maximally elevated bed,  First time was not a full stand max assist, second and thrid time from maximally elevated bed with heavy mod assist, stood from steady x 2 with min assist.           Balance Overall balance assessment: Needs assistance Sitting-balance support: Bilateral upper extremity supported;Feet supported Sitting balance-Leahy Scale: Poor Sitting balance - Comments: needs min to mod assist EOB, tendancy to tip to the right, able to attempt to help correct with cues and manual faciliation at trunk and pelvis.   Postural control: Posterior lean;Right lateral lean Standing balance support: Bilateral upper extremity supported Standing balance-Leahy Scale: Poor Standing balance comment: Pt able to stand in the steady standing frame multiple times for multiple minutes each time.  Cues for upright posture, extended hips, practiced weight shifting to his left hip and leg (as he has a tendancy to shift right), HR up to 139 max observed while standing, comes back down  to low 100s with seated rest breaks.                              Cognition Arousal/Alertness: Awake/alert Behavior During Therapy: WFL for tasks assessed/performed Overall Cognitive Status: History of cognitive impairments - at baseline                                  General Comments: most likely baseline cognition; note deficits with self regulatory behavior; making good progress      Exercises General Exercises - Lower Extremity Heel Slides: AROM;Both;10 reps Straight Leg Raises: AROM;Both;10 reps;Supine Other Exercises Other Exercises: bridges with PT stabilizing feet from sliding and knees from ER.  x10    General Comments General comments (skin integrity, edema, etc.): Encourage pt to do as much exercise on his own in the bed as he can (leg lifts, knee bends, shoulder exercises--as long as he doesn't make his R shoulder hurt too much).        Pertinent Vitals/Pain Pain Assessment: Faces Faces Pain Scale: Hurts even more Pain Location: rt shoulder Pain Descriptors / Indicators: Grimacing;Guarding Pain Intervention(s): Limited activity within patient's tolerance;Monitored during session;Repositioned           PT Goals (current goals can now be found in the care plan section) Acute Rehab PT Goals Patient Stated Goal: to get more therapy Progress towards PT goals: Progressing toward goals    Frequency    Min 3X/week      PT Plan Discharge plan needs to be updated       AM-PAC PT "6 Clicks" Mobility   Outcome Measure  Help needed turning from your back to your side while in a flat bed without using bedrails?: A Little Help needed moving from lying on your back to sitting on the side of a flat bed without using bedrails?: A Lot Help needed moving to and from a bed to a chair (including a wheelchair)?: Total Help needed standing up from a chair using your arms (e.g., wheelchair or bedside chair)?: Total Help needed to walk in hospital room?: Total Help needed climbing 3-5 steps with a railing? : Total 6 Click Score: 9    End of Session   Activity Tolerance: Patient limited by fatigue Patient left: in bed;with call bell/phone within reach;with bed alarm set(in chair position) Nurse Communication: Mobility status PT Visit  Diagnosis: Other abnormalities of gait and mobility (R26.89);Other symptoms and signs involving the nervous system (R29.898);Difficulty in walking, not elsewhere classified (R26.2)     Time: [id number] PT Time Calculation (min) (ACUTE ONLY): 49 min  Charges:  $Therapeutic Activity: 8-22 mins $Neuromuscular Re-education: 23-37 mins                    Carlotta Telfair B. Carianna Lague, PT, DPT  Acute Rehabilitation [phone] pager [phone] office  @ [place] [place]: [phone]   06/13/2019, 11:54 AM

## 2019-06-14 LAB — GLUCOSE, CAPILLARY
Glucose-Capillary: 102 mg/dL — ABNORMAL HIGH (ref 70–99)
Glucose-Capillary: 225 mg/dL — ABNORMAL HIGH (ref 70–99)
Glucose-Capillary: 138 mg/dL — ABNORMAL HIGH (ref 70–99)

## 2019-06-14 NOTE — Discharge Summary (Signed)
Physician Discharge Summary  Schneur Crowson ZOX:[id number] DOB: Jul 23, 1966 DOA: 06/09/2019  PCP: Gale Journey, MD  Admit date: 06/09/2019 Discharge date: 06/14/2019  Admitted From: Home Disposition:  Home  Recommendations for Outpatient Follow-up:  1. Follow up with PCP in 1-2 weeks 2. Please obtain BMP/CBC in one week   Home Health:No Equipment/Devices:None  Discharge Condition:Stable CODE STATUS:Full Diet recommendation: Heart Healthy   Brief/Interim Summary: 53 y.o. 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  He was a skilled nursing facility where he started having diarrhea, seen at [facility] and tested positive for COVID-19 infection  Discharge Diagnoses:  Principal Problem:   COVID-19 virus detected Active Problems:   Dehydration   TBI (traumatic brain injury) (HCC)   DM type 2 (diabetes mellitus, type 2) (HCC)   Hepatitis B   Liver transplanted (HCC) Acute COVID-19 viral detected with significant watery diarrhea: He remained asymptomatic from a pulmonary standpoint. He was treated with IV fluids and electrolytes were repleted. Remained satting greater than 93% on room air. Physical therapy was consulted recommended skilled nursing facility. 24 hours before discharge his diarrhea had resolved.  Central pontine melanosis: Continue feeding through PEG continue physical therapy. Physical therapy evaluated the patient and recommended skilled nursing facility.  History of hepatitis B/with the liver transplant: No changes made to his medication continue Prograf.  Acute kidney injury: Likely prerenal due to diarrhea resolved with IV fluid  hydration.  Hypomagnesemia: Repleted orally now resolved.  Diabetes mellitus type 2: No changes made to his medication.   Discharge Instructions  Discharge Instructions    Diet - low sodium heart healthy   Complete by: As directed    Increase activity slowly   Complete by: As directed      Allergies as of 06/14/2019   No Known Allergies     Medication List    TAKE these medications   acetaminophen 325 MG tablet Commonly known as: TYLENOL Take 650 mg by mouth every 6 (six) hours.   amLODipine 10 MG tablet Commonly known as: NORVASC Take 10 mg by mouth daily.   aspirin EC 81 MG tablet Take 81 mg by mouth daily.   atorvastatin 40 MG tablet Commonly known as: LIPITOR Take 40 mg by mouth at bedtime.   cyanocobalamin 100 MCG tablet Take 100 mcg by mouth daily.   entecavir 1 MG tablet Commonly known as: BARACLUDE TAKE 1 TABLET BY MOUTH EVERY DAY   fluticasone 50 MCG/ACT nasal spray Commonly known as: FLONASE Place 2 sprays into both nostrils daily.   gabapentin 100 MG capsule Commonly known as: NEURONTIN Take 100 mg by mouth 3 (three) times daily.   insulin regular 100 units/mL injection Commonly known as: NOVOLIN R Inject into the skin. Take 10 units twice daily;  Uses  Sliding scale three times daily with meals; SSI 151-200=2 units; 201-250=4 units; 251-300= 6 units; 301-350= 8 units; 351-400= 10 units   isosorbide dinitrate 20 MG tablet Commonly known as: ISORDIL Take 20 mg by mouth 3 (three) times daily.   labetalol 200 MG tablet Commonly known as: NORMODYNE Take 200 mg by mouth 3 (three) times daily.   loperamide 2 MG capsule Commonly known as: IMODIUM Take 4 mg by mouth as needed for diarrhea or loose stools.  losartan 25 MG tablet Commonly known as: COZAAR Take 25 mg by mouth daily.   magnesium oxide 400 MG tablet Commonly known as: MAG-OX Take 400 mg by mouth 2 (two) times daily.   metFORMIN 1000 MG tablet Commonly known as:  GLUCOPHAGE Take 1,000 mg by mouth 2 (two) times daily with a meal.   nitroGLYCERIN 0.4 MG SL tablet Commonly known as: NITROSTAT Place 0.4 mg under the tongue every 5 (five) minutes as needed for chest pain. Up to three doses   omeprazole 20 MG capsule Commonly known as: PRILOSEC Take 20 mg by mouth 2 (two) times a day.   polyethylene glycol 17 g packet Commonly known as: MIRALAX / GLYCOLAX Take 17 g by mouth daily as needed for mild constipation or moderate constipation.   tacrolimus 1 MG capsule Commonly known as: PROGRAF Take 4 mg by mouth 2 (two) times daily.       No Known Allergies  Consultations:  None   Procedures/Studies: Ct Abdomen Pelvis W Contrast  Result Date: 06/06/2019 CLINICAL DATA:  Generalized abdominal pain EXAM: CT ABDOMEN AND PELVIS WITH CONTRAST TECHNIQUE: Multidetector CT imaging of the abdomen and pelvis was performed using the standard protocol following bolus administration of intravenous contrast. CONTRAST:  100mL OMNIPAQUE IOHEXOL 300 MG/ML  SOLN COMPARISON:  02/24/2019. FINDINGS: Lower chest: No acute abnormality. Hepatobiliary: The patient appears to be status post prior cholecystectomy. There is some mild intrahepatic and extrahepatic biliary ductal dilatation. Pancreas: Unremarkable. No pancreatic ductal dilatation or surrounding inflammatory changes. Spleen: Normal in size without focal abnormality. Adrenals/Urinary Tract: There is a punctate nonobstructing stone in the upper pole the left kidney. There is no hydronephrosis the right kidney is unremarkable. The bladder is unremarkable. The bilateral adrenal glands are unremarkable. Stomach/Bowel: There is a well-positioned percutaneous gastrostomy tube in place. There is liquid stool throughout the colon consistent with diarrhea. There is no CT evidence of colitis or diverticulitis. The appendix is mildly dilated without significant periappendiceal inflammatory changes. There is no evidence of a  small-bowel obstruction. Vascular/Lymphatic: Aortic atherosclerosis. No enlarged abdominal or pelvic lymph nodes. Reproductive: Prostate is unremarkable. Other: There are bilateral fat containing inguinal hernias, left greater than right. Musculoskeletal: No acute or significant osseous findings. There are multiple old right-sided rib fractures. IMPRESSION: 1. Liquid stool throughout the colon consistent with diarrhea. 2. Well-positioned gastrostomy tube 3. Status post cholecystectomy. There is intrahepatic and extrahepatic biliary ductal dilatation which is stable from prior study. 4. Punctate nonobstructing stone in the upper pole the left kidney. Electronically Signed   By: Katherine Mantlehristopher  Green M.D.   On: 06/06/2019 22:00   Portable Chest 1 View  Result Date: 06/09/2019 CLINICAL DATA:  COVID-19 virus infection. EXAM: PORTABLE CHEST 1 VIEW COMPARISON:  02/24/2019 FINDINGS: Stable mild cardiomegaly. No evidence of pulmonary edema. A new focal nodular area of consolidation is seen in the right upper lobe. Left lung is clear. No evidence of pleural effusion. IMPRESSION: 1. New focal nodular consolidation in right upper lobe, likely due to infection. Continued radiographic follow-up is recommended to confirm resolution. 2. Stable mild cardiomegaly. Electronically Signed   By: Myles RosenthalJohn  Stahl M.D.   On: 06/09/2019 06:37     Subjective: Complains no further diarrhea in a joyful mood  Discharge Exam: Vitals:   06/14/19 0400 06/14/19 0755  BP:  (!) 138/91  Pulse:    Resp:    Temp: 98.5 F (36.9 C) 98.4 F (36.9 C)  SpO2:       General: Pt is alert,  awake, not in acute distress Cardiovascular: RRR, S1/S2 +, no rubs, no gallops Respiratory: CTA bilaterally, no wheezing, no rhonchi Abdominal: Soft, NT, ND, bowel sounds + Extremities: no edema, no cyanosis    The results of significant diagnostics from this hospitalization (including imaging, microbiology, ancillary and laboratory) are listed below  for reference.     Microbiology: Recent Results (from the past 240 hour(s))  SARS Coronavirus 2 (CEPHEID - Performed in [facility] hospital lab), Hosp Order     Status: Abnormal   Collection Time: 06/08/19 10:40 AM   Specimen: Nasopharyngeal Swab  Result Value Ref Range Status   SARS Coronavirus 2 POSITIVE (A) NEGATIVE Final    Comment: RESULT CALLED TO, READ BACK BY AND VERIFIED WITH: MEGAN JONES @1154  ON 06/08/2019 BY FMW (NOTE) If result is NEGATIVE SARS-CoV-2 target nucleic acids are NOT DETECTED. The SARS-CoV-2 RNA is generally detectable in upper and lower  respiratory specimens during the acute phase of infection. The lowest  concentration of SARS-CoV-2 viral copies this assay can detect is 250  copies / mL. A negative result does not preclude SARS-CoV-2 infection  and should not be used as the sole basis for treatment or other  patient management decisions.  A negative result may occur with  improper specimen collection / handling, submission of specimen other  than nasopharyngeal swab, presence of viral mutation(s) within the  areas targeted by this assay, and inadequate number of viral copies  (<250 copies / mL). A negative result must be combined with clinical  observations, patient history, and epidemiological information. If result is POSITIVE SARS-CoV-2 target nucleic acids are DETECTED.  The SARS-CoV-2 RNA is generally detectable in upper and lower  respiratory specimens during the acute phase of infection.  Positive  results are indicative of active infection with SARS-CoV-2.  Clinical  correlation with patient history and other diagnostic information is  necessary to determine patient infection status.  Positive results do  not rule out bacterial infection or co-infection with other viruses. If result is PRESUMPTIVE POSTIVE SARS-CoV-2 nucleic acids MAY BE PRESENT.   A presumptive positive result was obtained on the submitted specimen  and confirmed on repeat  testing.  While 2019 novel coronavirus  (SARS-CoV-2) nucleic acids may be present in the submitted sample  additional confirmatory testing may be necessary for epidemiological  and / or clinical management purposes  to differentiate between  SARS-CoV-2 and other Sarbecovirus currently known to infect humans.  If clinically indicated additional testing with an alternate test  methodology [phone])  is advised. The SARS-CoV-2 RNA is generally  detectable in upper and lower respiratory specimens during the acute  phase of infection. The expected result is Negative. Fact Sheet for Patients:  StrictlyIdeas.no Fact Sheet for Healthcare Providers: BankingDealers.co.za This test is not yet approved or cleared by the [place] FDA and has been authorized for detection and/or diagnosis of SARS-CoV-2 by FDA under an Emergency Use Authorization (EUA).  This EUA will remain in effect (meaning this test can be used) for the duration of the COVID-19 declaration under Section 564(b)(1) of the Act, 21 U.S.C. section 360bbb-3(b)(1), unless the authorization is terminated or revoked sooner. Performed at [facility], [address] [place], [place] [address]      Labs: BNP (last 3 results) No results for input(s): BNP in the last 8760 hours. Basic Metabolic Panel: Recent Labs  Lab 06/09/19 0605 06/11/19 0457 06/12/19 0457  NA 137 137  --   K 4.0 4.8  --  CL 106 108  --   CO2 21* 21*  --   GLUCOSE 81 86  --   BUN 22* 25*  --   CREATININE 1.19 1.13  --   CALCIUM 9.3 9.0  --   MG  --  1.4* 1.7   Liver Function Tests: Recent Labs  Lab 06/09/19 0605  AST 15  ALT 17  ALKPHOS 92  BILITOT 0.8  PROT 7.7  ALBUMIN 4.2   No results for input(s): LIPASE, AMYLASE in the last 168 hours. No results for input(s): AMMONIA in the last 168 hours. CBC: Recent Labs  Lab 06/09/19 0605  WBC 7.0  NEUTROABS 2.9  HGB 10.6*  HCT 32.3*   MCV 87.8  PLT 238   Cardiac Enzymes: No results for input(s): CKTOTAL, CKMB, CKMBINDEX, TROPONINI in the last 168 hours. BNP: Invalid input(s): POCBNP CBG: Recent Labs  Lab 06/13/19 0730 06/13/19 1141 06/13/19 1609 06/13/19 2048 06/14/19 0750  GLUCAP 99 154* 201* 164* 102*   D-Dimer No results for input(s): DDIMER in the last 72 hours. Hgb A1c No results for input(s): HGBA1C in the last 72 hours. Lipid Profile No results for input(s): CHOL, HDL, LDLCALC, TRIG, CHOLHDL, LDLDIRECT in the last 72 hours. Thyroid function studies No results for input(s): TSH, T4TOTAL, T3FREE, THYROIDAB in the last 72 hours.  Invalid input(s): FREET3 Anemia work up No results for input(s): VITAMINB12, FOLATE, FERRITIN, TIBC, IRON, RETICCTPCT in the last 72 hours. Urinalysis    Component Value Date/Time   COLORURINE YELLOW (A) 06/06/2019 2158   APPEARANCEUR HAZY (A) 06/06/2019 2158   LABSPEC 1.025 06/06/2019 2158   PHURINE 5.0 06/06/2019 2158   GLUCOSEU NEGATIVE 06/06/2019 2158   HGBUR NEGATIVE 06/06/2019 2158   BILIRUBINUR NEGATIVE 06/06/2019 2158   KETONESUR NEGATIVE 06/06/2019 2158   PROTEINUR NEGATIVE 06/06/2019 2158   NITRITE NEGATIVE 06/06/2019 2158   LEUKOCYTESUR NEGATIVE 06/06/2019 2158   Sepsis Labs Invalid input(s): PROCALCITONIN,  WBC,  LACTICIDVEN Microbiology Recent Results (from the past 240 hour(s))  SARS Coronavirus 2 (CEPHEID - Performed [facility]), Hosp Order     Status: Abnormal   Collection Time: 06/08/19 10:40 AM   Specimen: Nasopharyngeal Swab  Result Value Ref Range Status   SARS Coronavirus 2 POSITIVE (A) NEGATIVE Final    Comment: RESULT CALLED TO, READ BACK BY AND VERIFIED WITH: MEGAN JONES @1154  ON 06/08/2019 BY FMW (NOTE) If result is NEGATIVE SARS-CoV-2 target nucleic acids are NOT DETECTED. The SARS-CoV-2 RNA is generally detectable in upper and lower  respiratory specimens during the acute phase of infection. The lowest   concentration of SARS-CoV-2 viral copies this assay can detect is 250  copies / mL. A negative result does not preclude SARS-CoV-2 infection  and should not be used as the sole basis for treatment or other  patient management decisions.  A negative result may occur with  improper specimen collection / handling, submission of specimen other  than nasopharyngeal swab, presence of viral mutation(s) within the  areas targeted by this assay, and inadequate number of viral copies  (<250 copies / mL). A negative result must be combined with clinical  observations, patient history, and epidemiological information. If result is POSITIVE SARS-CoV-2 target nucleic acids are DETECTED.  The SARS-CoV-2 RNA is generally detectable in upper and lower  respiratory specimens during the acute phase of infection.  Positive  results are indicative of active infection with SARS-CoV-2.  Clinical  correlation with patient history and other diagnostic information is  necessary  to determine patient infection status.  Positive results do  not rule out bacterial infection or co-infection with other viruses. If result is PRESUMPTIVE POSTIVE SARS-CoV-2 nucleic acids MAY BE PRESENT.   A presumptive positive result was obtained on the submitted specimen  and confirmed on repeat testing.  While 2019 novel coronavirus  (SARS-CoV-2) nucleic acids may be present in the submitted sample  additional confirmatory testing may be necessary for epidemiological  and / or clinical management purposes  to differentiate between  SARS-CoV-2 and other Sarbecovirus currently known to infect humans.  If clinically indicated additional testing with an alternate test  methodology [phone]([id number])  is advised. The SARS-CoV-2 RNA is generally  detectable in upper and lower respiratory specimens during the acute  phase of infection. The expected result is Negative. Fact Sheet for Patients:  BoilerBrush.com.cyhttps://www.fda.gov/media/136312/download Fact Sheet  for Healthcare Providers: https://pope.com/https://www.fda.gov/media/136313/download This test is not yet approved or cleared by the [place] FDA and has been authorized for detection and/or diagnosis of SARS-CoV-2 by FDA under an Emergency Use Authorization (EUA).  This EUA will remain in effect (meaning this test can be used) for the duration of the COVID-19 declaration under Section 564(b)(1) of the Act, 21 U.S.C. section 360bbb-3(b)(1), unless the authorization is terminated or revoked sooner. Performed at [facility], [address] [place], [place] [address]      Time coordinating discharge: 40 minutes  SIGNED:   Marinda ElkAbraham Feliz Ortiz, MD  [facility]

## 2019-06-14 NOTE — Progress Notes (Signed)
Occupational Therapy Treatment Patient Details Name: Terry Ibarra MRN: [id number] DOB: 05/29/1966 Today's Date: 06/14/2019    History of present illness Pt adm after slid out of recliner at home and subsequently found to be Covid + (asymptomatic. Pt had been at brother's home since dc from SNF 1 week earlier after TBI in february. Brother unable to provide care. PMH - colonic injury, retrobulbar hemorrhage, posterior sixth rib fracture and traumatic subarachnoid hemorrhage, traumatic brain injury with residual weakness 01/2019, hepatitis B, hepatitis C, liver transplant 2017, latent tuberculosis   OT comments  Excellent participation with OT. Focus of session on soft tissue and scapular mobilization to increase functional use of RUE. Facilitating improved functional movement patterns RUE with use of PNF and NDT techniques. Issued bath mitt for pt to use when bathing due to his difficulty sustaining a grip on a cloth due to apraxia. Pt very appreciative. Pt will benefit from continued post acute rehab.   Follow Up Recommendations  SNF;CIR    Equipment Recommendations  Other (comment)(TBA)    Recommendations for Other Services      Precautions / Restrictions Precautions Precautions: Fall Precaution Comments: ataxic       Mobility Bed Mobility     Rolling: Min assist            Transfers                      Balance                                           ADL either performed or assessed with clinical judgement   ADL                                         General ADL Comments: Issued bath mitt. Pt able ot bath UB with set up with use of mitt     Vision       Perception     Praxis      Cognition Arousal/Alertness: Awake/alert Behavior During Therapy: WFL for tasks assessed/performed Overall Cognitive Status: History of cognitive impairments - at baseline                                           Exercises Other Exercises Other Exercises: PNF scapular resistive ex with strengthenig Other Exercises: PNF D1/D2 patterns in supine Other Exercises: Use of rolling onto R side/body on arm movements to increase shoulder ROM due to tightness in pects Other Exercises: rythmic stabilization for shoulder girlde and core strengthening   Shoulder Instructions       General Comments      Pertinent Vitals/ Pain       Pain Assessment: Faces Faces Pain Scale: Hurts a little bit Pain Location: (discomfort with stretching) Pain Descriptors / Indicators: Discomfort Pain Intervention(s): Limited activity within patient's tolerance  Home Living                                          Prior Functioning/Environment  Frequency  Min 3X/week        Progress Toward Goals  OT Goals(current goals can now be found in the care plan section)  Progress towards OT goals: Progressing toward goals  Acute Rehab OT Goals Patient Stated Goal: to get more therapy OT Goal Formulation: With patient Time For Goal Achievement: 06/24/19 Potential to Achieve Goals: Good ADL Goals Pt Will Perform Eating: with set-up;with adaptive utensils;sitting;bed level Pt Will Perform Grooming: with set-up;with supervision;with adaptive equipment;sitting Pt Will Perform Upper Body Bathing: with min guard assist;sitting Pt Will Perform Upper Body Dressing: with min assist;sitting Pt/caregiver will Perform Home Exercise Program: Increased strength;Increased ROM;Both right and left upper extremity;With written HEP provided  Plan Discharge plan remains appropriate;Frequency remains appropriate    Co-evaluation                 AM-PAC OT "6 Clicks" Daily Activity     Outcome Measure   Help from another person eating meals?: A Little Help from another person taking care of personal grooming?: A Little Help from another person toileting, which includes using toliet,  bedpan, or urinal?: A Lot Help from another person bathing (including washing, rinsing, drying)?: A Lot Help from another person to put on and taking off regular upper body clothing?: A Little Help from another person to put on and taking off regular lower body clothing?: A Lot 6 Click Score: 15    End of Session    OT Visit Diagnosis: Other abnormalities of gait and mobility (R26.89);Muscle weakness (generalized) (M62.81)   Activity Tolerance Patient tolerated treatment well   Patient Left in bed;with call bell/phone within reach(chair position)   Nurse Communication Mobility status        Time: 1503(1503)-1550 OT Time Calculation (min): 47 min  Charges: OT General Charges $OT Visit: 1 Visit OT Treatments $Self Care/Home Management : 8-22 mins $Neuromuscular Re-education: 23-37 mins  Terry Ibarra, OT/L   Acute OT Clinical Specialist Acute Rehabilitation Services Pager [phone] Office [phone]    [facility] Hospital[facility],[name] 06/14/2019, 6:44 PM

## 2019-06-15 LAB — GLUCOSE, CAPILLARY
Glucose-Capillary: 195 mg/dL — ABNORMAL HIGH (ref 70–99)
Glucose-Capillary: 108 mg/dL — ABNORMAL HIGH (ref 70–99)
Glucose-Capillary: 146 mg/dL — ABNORMAL HIGH (ref 70–99)
Glucose-Capillary: 149 mg/dL — ABNORMAL HIGH (ref 70–99)
Glucose-Capillary: 156 mg/dL — ABNORMAL HIGH (ref 70–99)

## 2019-06-15 LAB — SARS CORONAVIRUS 2 BY RT PCR (HOSPITAL ORDER, PERFORMED IN ~~LOC~~ HOSPITAL LAB): SARS Coronavirus 2: POSITIVE — AB

## 2019-06-15 NOTE — Progress Notes (Signed)
Physical Therapy Treatment Patient Details Name: Terry Ibarra MRN: [id number] DOB: [birth year] Today's Date: 06/15/2019    History of Present Illness Pt adm after slid out of recliner at home and subsequently found to be Covid + (asymptomatic. Pt had been at brother's home since dc from SNF 1 week earlier after TBI in february. Brother unable to provide care. 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 Did not tolerate upright position as well (dizziness with BP decreasing to 90s/60s MAP 77, previous MAP 92). Patient had just eaten a large meal, room was very hot, and upon return to bed he had a BM--perhaps all this contributed to his orthostasis today which was not an issue last visit. Patient remains highly motivated.    Follow Up Recommendations  Supervision/Assistance - 24 hour;SNF(CIR denied admission)     Equipment Recommendations  Wheelchair (measurements PT);Wheelchair cushion (measurements PT);3in1 (PT)    Recommendations for Other Services       Precautions / Restrictions Precautions Precautions: Fall Precaution Comments: ataxic    Mobility  Bed Mobility Overal bed mobility: Needs Assistance Bed Mobility: Rolling;Sidelying to Sit;Sit to Sidelying Rolling: Min assist(to the right and left; ) Sidelying to sit: Mod assist(HOB 10, + rail)     Sit to sidelying: +2 for physical assistance;Max assist General bed mobility comments: Patient with orthostasis while standing and needed incr assist to return to supine  Transfers Overall transfer level: Needs assistance   Transfers: Sit to/from Stand Sit to Stand: Mod assist;Min assist;+2 safety/equipment         General transfer  comment: Pt stood multiple times from bed at lowest height,  First time was mod assist +2, second and thrid time from stedy seat with min guard-min assist. Became dizzy with 3rd stand and knees buckled with quick descent to sit on Steady seat  Ambulation/Gait                 Stairs             Wheelchair Mobility    Modified Rankin (Stroke Patients Only)       Balance Overall balance assessment: Needs assistance Sitting-balance support: Bilateral upper extremity supported;Feet supported Sitting balance-Leahy Scale: Poor Sitting balance - Comments: on steady seat, able to maintain balance with no UE support and tolerate perturbations in each direction   Standing balance support: Bilateral upper extremity supported Standing balance-Leahy Scale: Poor Standing balance comment: Pt able to stand in the steady standing frame multiple times for multiple minutes each time.  Cues for upright posture, extended hips, practiced weight shifting to his left hip and leg (as he has a tendancy to shift right), HR up to 139 max observed while standing, comes back down to low 100s with seated rest breaks.                              Cognition Arousal/Alertness: Awake/alert Behavior During Therapy: WFL for tasks assessed/performed Overall Cognitive Status: History of cognitive impairments - at baseline                                 General Comments: most  likely baseline cognition; note deficits with self regulatory behavior; making good progress      Exercises Other Exercises Other Exercises: bridges with PT stabilizing feet from sliding and knees from ER for pt to assist with scoot up in bed and scoot hips laterally    General Comments General comments (skin integrity, edema, etc.): On third standing, working on weight-shifting rt and left, pt then reporting feeling dizzy and knees gave out with steady providing support and pt sat down on seat       Pertinent Vitals/Pain Faces Pain Scale: No hurt    Home Living                      Prior Function            PT Goals (current goals can now be found in the care plan section) Acute Rehab PT Goals Patient Stated Goal: to get more therapy Time For Goal Achievement: 06/23/19 Progress towards PT goals: Progressing toward goals    Frequency    Min 3X/week      PT Plan Discharge plan needs to be updated    Co-evaluation              AM-PAC PT "6 Clicks" Mobility   Outcome Measure  Help needed turning from your back to your side while in a flat bed without using bedrails?: A Little Help needed moving from lying on your back to sitting on the side of a flat bed without using bedrails?: A Lot Help needed moving to and from a bed to a chair (including a wheelchair)?: Total Help needed standing up from a chair using your arms (e.g., wheelchair or bedside chair)?: Total Help needed to walk in hospital room?: Total Help needed climbing 3-5 steps with a railing? : Total 6 Click Score: 9    End of Session Equipment Utilized During Treatment: Other (comment)(Steady) Activity Tolerance: Patient limited by fatigue;Treatment limited secondary to medical complications (Comment)(decr BP) Patient left: in bed;with call bell/phone within reach;with bed alarm set(in chair position)   PT Visit Diagnosis: Other abnormalities of gait and mobility (R26.89);Other symptoms and signs involving the nervous system (R29.898);Difficulty in walking, not elsewhere classified (R26.2)     Time: [id number] PT Time Calculation (min) (ACUTE ONLY): 35 min  Charges:  $Therapeutic Activity: 23-37 mins                        [employer]ynn P Karee Forge, PT 06/15/2019, 9:31 AM

## 2019-06-15 NOTE — TOC Progression Note (Signed)
Transition of Care [facility]) - Progression Note    Patient Details  Name: Terry Ibarra MRN: [id number] Date of Birth: [birth year]  Transition of Care [facility]) CM/SW [place], [place] Phone Number: 06/15/2019, 5:47 PM  Clinical Narrative:  CSW following for discharge plan. Patient needs to have an updated positive COVID test to admit to SNF, awaiting results. Patient will have to DC on Monday as [facility] does not allow weekend admissions. CSW to follow.     Expected Discharge Plan: Skilled Nursing Facility Barriers to Discharge: SNF Pending bed offer  Expected Discharge Plan and Services Expected Discharge Plan: [place]: [place] arrangements for the past 2 months: Single Family Home Expected Discharge Date: 06/14/19                                     Social Determinants of Health (SDOH) Interventions    Readmission Risk Interventions No flowsheet data found.

## 2019-06-15 NOTE — Progress Notes (Signed)
Called patient's daughter, Huriel Matt, at [phone] to for an update. She stated she really appreciated the call, update and care we are giving her Dad.

## 2019-06-15 NOTE — Progress Notes (Signed)
[facility] PROGRESS NOTE    Progress Note  Terry Ibarra  ZOX:[id number] DOB: 27-Aug-1966 DOA: 06/09/2019 PCP: Gale JourneyWalsh, Catherine P, MD     Brief Narrative:   Terry NickelsJohn Matthew Hallmon is an 53 y.o. 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  He was a skilled nursing facility where he started having diarrhea, seen at [facility] and tested positive for COVID-19 infection  Assessment/Plan:   Acute COVID-19 virus detected with significant watery diarrhea: Lungs seem to be clear she is satting greater than 93% on room air. Patient relates his diarrhea has resolved. Awaiting skilled nursing facility placement.    Central melanosis/chronic right-sided weakness: Tube in place, physical therapy recommended skilled nursing facility.  History of hepatitis B/with liver transplant.: Continue Prograf.  Acute kidney injury: Likely prerenal due to diarrhea resolved with IV fluid hydration.  Hypomagnesemia: Repleted orally.  Diabetes mellitus type 2: Continue sliding scale insulin.    DVT prophylaxis: lovenxo Family Communication:none Disposition Plan/Barrier to D/C: SNF hopefully today. Code Status:     Code Status Orders  (From admission, onward)         Start     Ordered   06/09/19 0530  Full code  Continuous     06/09/19 0532        Code Status History    This patient has a current code status but no historical code status.   Advance Care Planning Activity        IV Access:    Peripheral IV   Procedures and diagnostic studies:   No results found.   Medical Consultants:    None.  Anti-Infectives:    Subjective:    Terry NickelsJohn Matthew Grandstaff no  complaints feels great  Objective:    Vitals:   06/14/19 1728 06/14/19 1932 06/15/19 0224 06/15/19 0359  BP:  114/85  115/76  Pulse:  82 65 79  Resp:  18  (!) 24  Temp: 97.6 F (36.4 C) 97.8 F (36.6 C)  98.2 F (36.8 C)  TempSrc: Oral Oral  Oral  SpO2:  98% 99% 99%  Weight:      Height:        Intake/Output Summary (Last 24 hours) at 06/15/2019 0734 Last data filed at 06/15/2019 0656 Gross per 24 hour  Intake 1803 ml  Output 675 ml  Net 1128 ml   Filed Weights   06/09/19 0335  Weight: 89 kg    Exam: General exam: In no acute distress. Respiratory system: Good air movement and clear to auscultation. Cardiovascular system: S1 & S2 heard, RRR. No JVD.Marland Kitchen.  Gastrointestinal system: Abdomen is nondistended, soft and nontender.  Extremities: No pedal edema. Skin: No rashes, lesions or ulcers Psychiatry: Judgement and insight appear normal. Mood & affect appropriate.     Data Reviewed:    Labs: Basic Metabolic Panel: Recent Labs  Lab 06/09/19 0605 06/11/19 0457 06/12/19 0457  NA 137 137  --   K 4.0 4.8  --   CL 106 108  --   CO2 21* 21*  --   GLUCOSE 81 86  --   BUN 22* 25*  --   CREATININE 1.19 1.13  --   CALCIUM 9.3 9.0  --   MG  --  1.4* 1.7  GFR Estimated Creatinine Clearance: 81.1 mL/min (by C-G formula based on SCr of 1.13 mg/dL). Liver Function Tests: Recent Labs  Lab 06/09/19 0605  AST 15  ALT 17  ALKPHOS 92  BILITOT 0.8  PROT 7.7  ALBUMIN 4.2   No results for input(s): LIPASE, AMYLASE in the last 168 hours. No results for input(s): AMMONIA in the last 168 hours. Coagulation profile No results for input(s): INR, PROTIME in the last 168 hours. COVID-19 Labs  No results for input(s): DDIMER, FERRITIN, LDH, CRP in the last 72 hours.  Lab Results  Component Value Date   SARSCOV2NAA POSITIVE (A) 06/08/2019    CBC: Recent Labs  Lab 06/09/19 0605  WBC 7.0  NEUTROABS 2.9  HGB 10.6*  HCT 32.3*  MCV 87.8  PLT 238   Cardiac  Enzymes: No results for input(s): CKTOTAL, CKMB, CKMBINDEX, TROPONINI in the last 168 hours. BNP (last 3 results) No results for input(s): PROBNP in the last 8760 hours. CBG: Recent Labs  Lab 06/13/19 2048 06/14/19 0750 06/14/19 1231 06/14/19 1727 06/14/19 2006  GLUCAP 164* 102* 225* 138* 195*   D-Dimer: No results for input(s): DDIMER in the last 72 hours. Hgb A1c: No results for input(s): HGBA1C in the last 72 hours. Lipid Profile: No results for input(s): CHOL, HDL, LDLCALC, TRIG, CHOLHDL, LDLDIRECT in the last 72 hours. Thyroid function studies: No results for input(s): TSH, T4TOTAL, T3FREE, THYROIDAB in the last 72 hours.  Invalid input(s): FREET3 Anemia work up: No results for input(s): VITAMINB12, FOLATE, FERRITIN, TIBC, IRON, RETICCTPCT in the last 72 hours. Sepsis Labs: Recent Labs  Lab 06/09/19 0605  WBC 7.0   Microbiology Recent Results (from the past 240 hour(s))  SARS Coronavirus 2 (CEPHEID - Performed in [place] hospital lab), Hosp Order     Status: Abnormal   Collection Time: 06/08/19 10:40 AM   Specimen: Nasopharyngeal Swab  Result Value Ref Range Status   SARS Coronavirus 2 POSITIVE (A) NEGATIVE Final    Comment: RESULT CALLED TO, READ BACK BY AND VERIFIED WITH: MEGAN JONES @1154  ON 06/08/2019 BY [facility] (NOTE) If result is NEGATIVE SARS-CoV-2 target nucleic acids are NOT DETECTED. The SARS-CoV-2 RNA is generally detectable in upper and lower  respiratory specimens during the acute phase of infection. The lowest  concentration of SARS-CoV-2 viral copies this assay can detect is 250  copies / mL. A negative result does not preclude SARS-CoV-2 infection  and should not be used as the sole basis for treatment or other  patient management decisions.  A negative result may occur with  improper specimen collection / handling, submission of specimen other  than nasopharyngeal swab, presence of viral mutation(s) within the  areas targeted by this assay,  and inadequate number of viral copies  (<250 copies / mL). A negative result must be combined with clinical  observations, patient history, and epidemiological information. If result is POSITIVE SARS-CoV-2 target nucleic acids are DETECTED.  The SARS-CoV-2 RNA is generally detectable in upper and lower  respiratory specimens during the acute phase of infection.  Positive  results are indicative of active infection with SARS-CoV-2.  Clinical  correlation with patient history and other diagnostic information is  necessary to determine patient infection status.  Positive results do  not rule out bacterial infection or co-infection with other viruses. If result is PRESUMPTIVE POSTIVE SARS-CoV-2 nucleic acids MAY BE PRESENT.   A presumptive positive result was obtained on the submitted specimen  and confirmed on repeat testing.  While 2019 novel  coronavirus  (SARS-CoV-2) nucleic acids may be present in the submitted sample  additional confirmatory testing may be necessary for epidemiological  and / or clinical management purposes  to differentiate between  SARS-CoV-2 and other Sarbecovirus currently known to infect humans.  If clinically indicated additional testing with an alternate test  methodology [phone]  is advised. The SARS-CoV-2 RNA is generally  detectable in upper and lower respiratory specimens during the acute  phase of infection. The expected result is Negative. Fact Sheet for Patients:  BoilerBrush.com.cyhttps://www.fda.gov/media/136312/download Fact Sheet for Healthcare Providers: https://pope.com/https://www.fda.gov/media/136313/download This test is not yet approved or cleared by the [place] FDA and has been authorized for detection and/or diagnosis of SARS-CoV-2 by FDA under an Emergency Use Authorization (EUA).  This EUA will remain in effect (meaning this test can be used) for the duration of the COVID-19 declaration under Section 564(b)(1) of the Act, 21 U.S.C. section 360bbb-3(b)(1), unless  the authorization is terminated or revoked sooner. Performed at [facility], [address] [place], [place] [id number]      Medications:   . amLODipine  10 mg Oral Daily  . aspirin EC  81 mg Oral Daily  . atorvastatin  40 mg Oral QHS  . diclofenac sodium  2 g Topical QID  . enoxaparin (LOVENOX) injection  40 mg Subcutaneous Q24H  . entecavir  1 mg Oral Daily  . fluticasone  2 spray Each Nare Daily  . gabapentin  100 mg Oral TID  . insulin aspart  0-5 Units Subcutaneous QHS  . insulin aspart  0-9 Units Subcutaneous TID WC  . isosorbide dinitrate  20 mg Oral TID  . labetalol  200 mg Oral TID  . losartan  25 mg Oral Daily  . pantoprazole  40 mg Oral Daily  . sodium chloride flush  3 mL Intravenous Q12H  . tacrolimus  4 mg Oral BID   Continuous Infusions:    LOS: 5 days   Marinda ElkAbraham Feliz Ortiz  [facility]  06/15/2019, 7:34 AM

## 2019-06-16 LAB — GLUCOSE, CAPILLARY
Glucose-Capillary: 120 mg/dL — ABNORMAL HIGH (ref 70–99)
Glucose-Capillary: 168 mg/dL — ABNORMAL HIGH (ref 70–99)
Glucose-Capillary: 92 mg/dL (ref 70–99)
Glucose-Capillary: 173 mg/dL — ABNORMAL HIGH (ref 70–99)

## 2019-06-16 NOTE — Progress Notes (Signed)
[facility] PROGRESS NOTE    Progress Note  Newman NickelsJohn Matthew Vea  WUJ:[id number] DOB: 03-22-1966 DOA: 06/09/2019 PCP: Gale JourneyWalsh, Catherine P, MD     Brief Narrative:   Newman NickelsJohn Matthew Papp is an 53 y.o. 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  He was a skilled nursing facility where he started having diarrhea, seen at [facility] and tested positive for COVID-19 infection  Assessment/Plan:   Acute COVID-19 virus detected with significant watery diarrhea: Lungs seem to be clear she is satting greater than 93% on room air. Patient relates his diarrhea has resolved. Awaiting skilled nursing facility placement.    Central melanosis/chronic right-sided weakness: Peg-tube in place, physical therapy recommended skilled nursing facility.  History of hepatitis B/with liver transplant.: Continue Prograf.  Acute kidney injury: Likely prerenal due to diarrhea resolved with IV fluid hydration.  Hypomagnesemia: Repleted orally.  Diabetes mellitus type 2: Continue sliding scale insulin.    DVT prophylaxis: lovenxo Family Communication:none Disposition Plan/Barrier to D/C: SNF on 6.22.2020 Code Status:     Code Status Orders  (From admission, onward)         Start     Ordered   06/09/19 0530  Full code  Continuous     06/09/19 0532        Code Status History    This patient has a current code status but no historical code status.   Advance Care Planning Activity        IV Access:    Peripheral IV   Procedures and diagnostic studies:   No results found.   Medical Consultants:    None.  Anti-Infectives:    Subjective:    Newman NickelsJohn Matthew Vargus no  complaints feels great.  Objective:    Vitals:   06/16/19 0000 06/16/19 0500 06/16/19 0823 06/16/19 0914  BP:  113/79  132/89  Pulse: 71 77  83  Resp:      Temp:  98.5 F (36.9 C) 98.3 F (36.8 C)   TempSrc:  Oral Oral   SpO2: 98% 99%    Weight:      Height:        Intake/Output Summary (Last 24 hours) at 06/16/2019 1024 Last data filed at 06/16/2019 0919 Gross per 24 hour  Intake 846 ml  Output 625 ml  Net 221 ml   Filed Weights   06/09/19 0335  Weight: 89 kg    Exam: General exam: In no acute distress. Respiratory system: Good air movement and clear to auscultation. Cardiovascular system: S1 & S2 heard, RRR. No JVD. Gastrointestinal system: Abdomen is nondistended, soft and nontender.  Extremities: No pedal edema. Skin: No rashes, lesions or ulcers Psychiatry: Judgement and insight appear normal. Mood & affect appropriate.      Data Reviewed:    Labs: Basic Metabolic Panel: Recent Labs  Lab 06/11/19 0457 06/12/19 0457  NA 137  --   K 4.8  --   CL 108  --   CO2 21*  --   GLUCOSE 86  --   BUN 25*  --   CREATININE 1.13  --   CALCIUM 9.0  --   MG 1.4* 1.7   GFR Estimated Creatinine Clearance: 81.1 mL/min (by C-G formula based on SCr of 1.13 mg/dL). Liver Function  Tests: No results for input(s): AST, ALT, ALKPHOS, BILITOT, PROT, ALBUMIN in the last 168 hours. No results for input(s): LIPASE, AMYLASE in the last 168 hours. No results for input(s): AMMONIA in the last 168 hours. Coagulation profile No results for input(s): INR, PROTIME in the last 168 hours. COVID-19 Labs  No results for input(s): DDIMER, FERRITIN, LDH, CRP in the last 72 hours.  Lab Results  Component Value Date   SARSCOV2NAA POSITIVE (A) 06/14/2019   SARSCOV2NAA POSITIVE (A) 06/08/2019    CBC: No results for input(s): WBC, NEUTROABS, HGB, HCT, MCV, PLT in the last 168 hours. Cardiac Enzymes: No results for input(s): CKTOTAL, CKMB, CKMBINDEX, TROPONINI in the last 168  hours. BNP (last 3 results) No results for input(s): PROBNP in the last 8760 hours. CBG: Recent Labs  Lab 06/15/19 0734 06/15/19 1157 06/15/19 1714 06/15/19 2133 06/16/19 0826  GLUCAP 108* 146* 149* 156* 120*   D-Dimer: No results for input(s): DDIMER in the last 72 hours. Hgb A1c: No results for input(s): HGBA1C in the last 72 hours. Lipid Profile: No results for input(s): CHOL, HDL, LDLCALC, TRIG, CHOLHDL, LDLDIRECT in the last 72 hours. Thyroid function studies: No results for input(s): TSH, T4TOTAL, T3FREE, THYROIDAB in the last 72 hours.  Invalid input(s): FREET3 Anemia work up: No results for input(s): VITAMINB12, FOLATE, FERRITIN, TIBC, IRON, RETICCTPCT in the last 72 hours. Sepsis Labs: No results for input(s): PROCALCITON, WBC, LATICACIDVEN in the last 168 hours. Microbiology Recent Results (from the past 240 hour(s))  SARS Coronavirus 2 (CEPHEID - Performed in [facility] hospital lab), Hosp Order     Status: Abnormal   Collection Time: 06/08/19 10:40 AM   Specimen: Nasopharyngeal Swab  Result Value Ref Range Status   SARS Coronavirus 2 POSITIVE (A) NEGATIVE Final    Comment: RESULT CALLED TO, READ BACK BY AND VERIFIED WITH: MEGAN JONES @1154  ON 06/08/2019 BY FMW (NOTE) If result is NEGATIVE SARS-CoV-2 target nucleic acids are NOT DETECTED. The SARS-CoV-2 RNA is generally detectable in upper and lower  respiratory specimens during the acute phase of infection. The lowest  concentration of SARS-CoV-2 viral copies this assay can detect is 250  copies / mL. A negative result does not preclude SARS-CoV-2 infection  and should not be used as the sole basis for treatment or other  patient management decisions.  A negative result may occur with  improper specimen collection / handling, submission of specimen other  than nasopharyngeal swab, presence of viral mutation(s) within the  areas targeted by this assay, and inadequate number of viral copies  (<250 copies /  mL). A negative result must be combined with clinical  observations, patient history, and epidemiological information. If result is POSITIVE SARS-CoV-2 target nucleic acids are DETECTED.  The SARS-CoV-2 RNA is generally detectable in upper and lower  respiratory specimens during the acute phase of infection.  Positive  results are indicative of active infection with SARS-CoV-2.  Clinical  correlation with patient history and other diagnostic information is  necessary to determine patient infection status.  Positive results do  not rule out bacterial infection or co-infection with other viruses. If result is PRESUMPTIVE POSTIVE SARS-CoV-2 nucleic acids MAY BE PRESENT.   A presumptive positive result was obtained on the submitted specimen  and confirmed on repeat testing.  While 2019 novel coronavirus  (SARS-CoV-2) nucleic acids may be present in the submitted sample  additional confirmatory testing may be necessary for epidemiological  and / or clinical management purposes  to differentiate between  SARS-CoV-2 and other Sarbecovirus currently known to infect humans.  If clinically indicated additional testing with an alternate test  methodology [phone]([id number])  is advised. The SARS-CoV-2 RNA is generally  detectable in upper and lower respiratory specimens during the acute  phase of infection. The expected result is Negative. Fact Sheet for Patients:  BoilerBrush.com.cyhttps://www.fda.gov/media/136312/download Fact Sheet for Healthcare Providers: https://pope.com/https://www.fda.gov/media/136313/download This test is not yet approved or cleared by the [place] FDA and has been authorized for detection and/or diagnosis of SARS-CoV-2 by FDA under an Emergency Use Authorization (EUA).  This EUA will remain in effect (meaning this test can be used) for the duration of the COVID-19 declaration under Section 564(b)(1) of the Act, 21 U.S.C. section 360bbb-3(b)(1), unless the authorization is terminated or revoked sooner.  Performed at [facility], [address] [place], [place] [address]   SARS Coronavirus 2 (CEPHEID - Performed in [facility] hospital lab), Hosp Order     Status: Abnormal   Collection Time: 06/14/19  3:30 PM   Specimen: Nasopharyngeal Swab  Result Value Ref Range Status   SARS Coronavirus 2 POSITIVE (A) NEGATIVE Final    Comment: RESULT CALLED TO, READ BACK BY AND VERIFIED WITH: BROWN,A RN @1728  ON 06/15/2019 JACKSON,K (NOTE) If result is NEGATIVE SARS-CoV-2 target nucleic acids are NOT DETECTED. The SARS-CoV-2 RNA is generally detectable in upper and lower  respiratory specimens during the acute phase of infection. The lowest  concentration of SARS-CoV-2 viral copies this assay can detect is 250  copies / mL. A negative result does not preclude SARS-CoV-2 infection  and should not be used as the sole basis for treatment or other  patient management decisions.  A negative result may occur with  improper specimen collection / handling, submission of specimen other  than nasopharyngeal swab, presence of viral mutation(s) within the  areas targeted by this assay, and inadequate number of viral copies  (<250 copies / mL). A negative result must be combined with clinical  observations, patient history, and epidemiological information. If result is POSITIVE SARS-CoV-2 target nucleic acids are DETECTED . The SARS-CoV-2 RNA is generally detectable in upper and lower  respiratory specimens during the acute phase of infection.  Positive  results are indicative of active infection with SARS-CoV-2.  Clinical  correlation with patient history and other diagnostic information is  necessary to determine patient infection status.  Positive results do  not rule out bacterial infection or co-infection with other viruses. If result is PRESUMPTIVE POSTIVE SARS-CoV-2 nucleic acids MAY BE PRESENT.   A presumptive positive result was obtained on the submitted specimen  and confirmed on  repeat testing.  While 2019 novel coronavirus  (SARS-CoV-2) nucleic acids may be present in the submitted sample  additional confirmatory testing may be necessary for epidemiological  and / or clinical management purposes  to differentiate between  SARS-CoV-2 and other Sarbecovirus currently known to infect humans.  If clinically indicated additional testing with an alternate test  methodology [phone]([id number] ) is advised. The SARS-CoV-2 RNA is generally  detectable in upper and lower respiratory specimens during the acute  phase of infection. The expected result is Negative. Fact Sheet for Patients:  BoilerBrush.com.cyhttps://www.fda.gov/media/136312/download Fact Sheet for Healthcare Providers: https://pope.com/https://www.fda.gov/media/136313/download This test is not yet approved or cleared by the [place] FDA and has been authorized for detection and/or diagnosis of SARS-CoV-2 by FDA under an Emergency Use Authorization (EUA).  This EUA will remain in effect (meaning this test can be used) for the duration of the COVID-19  declaration under Section 564(b)(1) of the Act, 21 U.S.C. section 360bbb-3(b)(1), unless the authorization is terminated or revoked sooner. Performed at [facility], [place] [address] [place], [place] [address]      Medications:   . amLODipine  10 mg Oral Daily  . aspirin EC  81 mg Oral Daily  . atorvastatin  40 mg Oral QHS  . diclofenac sodium  2 g Topical QID  . enoxaparin (LOVENOX) injection  40 mg Subcutaneous Q24H  . entecavir  1 mg Oral Daily  . fluticasone  2 spray Each Nare Daily  . gabapentin  100 mg Oral TID  . insulin aspart  0-5 Units Subcutaneous QHS  . insulin aspart  0-9 Units Subcutaneous TID WC  . isosorbide dinitrate  20 mg Oral TID  . labetalol  200 mg Oral TID  . losartan  25 mg Oral Daily  . pantoprazole  40 mg Oral Daily  . sodium chloride flush  3 mL Intravenous Q12H  . tacrolimus  4 mg Oral BID   Continuous Infusions:    LOS: 6 days    Charlynne Cousins  [facility]  06/16/2019, 10:24 AM

## 2019-06-16 NOTE — Progress Notes (Signed)
Alert and oriented x 4. Afebrile. Anticipating discharge to SNF. Awaiting SNF approval. O2 SAT within normal limits. Call bell within reach. Encouraged to report signs and symptoms to RN.

## 2019-06-16 NOTE — Progress Notes (Signed)
Patient stated that I did not need to call his daughter with an update.

## 2019-06-17 LAB — GLUCOSE, CAPILLARY
Glucose-Capillary: 104 mg/dL — ABNORMAL HIGH (ref 70–99)
Glucose-Capillary: 127 mg/dL — ABNORMAL HIGH (ref 70–99)
Glucose-Capillary: 116 mg/dL — ABNORMAL HIGH (ref 70–99)
Glucose-Capillary: 142 mg/dL — ABNORMAL HIGH (ref 70–99)

## 2019-06-17 MED ORDER — TRAMADOL HCL 50 MG PO TABS
50.0000 mg | ORAL_TABLET | Freq: Four times a day (QID) | ORAL | Status: DC | PRN
  Administered 2019-06-17 – 2019-06-20 (×7): 50 mg via ORAL
  Filled 2019-06-17 (×7): qty 1

## 2019-06-17 NOTE — Progress Notes (Signed)
[facility] PROGRESS NOTE    Progress Note  Terry Ibarra Palinkas  ZOX:[id number] DOB: 06-28-1966 DOA: 06/09/2019 PCP: Gale JourneyWalsh, Catherine P, MD     Brief Narrative:   Terry Ibarra Well is an 53 y.o. 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  He was a skilled nursing facility where he started having diarrhea, seen [facility] and tested positive for COVID-19 infection  Assessment/Plan:   Acute COVID-19 virus detected with significant watery diarrhea: Lungs seem to be clear he is satting greater than 93% on room air. Patient relates his diarrhea has resolved. Awaiting skilled nursing facility placement.    Central melanosis/chronic right-sided weakness: Peg-tube in place, physical therapy recommended skilled nursing facility. Continue tube feedings.  History of hepatitis B/with liver transplant.: Continue Prograf.  Acute kidney injury: Likely prerenal due to diarrhea resolved with IV fluid hydration.  Hypomagnesemia: Repleted orally.  Diabetes mellitus type 2: Continue sliding scale insulin.    DVT prophylaxis: lovenxo Family Communication:none Disposition Plan/Barrier to D/C: SNF on 6.22.2020 Code Status:     Code Status Orders  (From admission, onward)         Start     Ordered   06/09/19 0530  Full code  Continuous     06/09/19 0532        Code Status History    This patient has a current code status but no historical code status.   Advance Care Planning Activity        IV Access:    Peripheral IV   Procedures and diagnostic studies:   No results found.   Medical Consultants:    None.  Anti-Infectives:    Subjective:     Terry Ibarra Leeb relates no new complaints.  Objective:    Vitals:   06/16/19 1954 06/17/19 0000 06/17/19 0400 06/17/19 0514  BP: 113/77   117/77  Pulse: 74 77 67 72  Resp: 18   18  Temp: 98.4 F (36.9 C)   98.7 F (37.1 C)  TempSrc: Oral   Oral  SpO2: 98% 100% 100% 99%  Weight:      Height:        Intake/Output Summary (Last 24 hours) at 06/17/2019 0800 Last data filed at 06/17/2019 0327 Gross per 24 hour  Intake 703 ml  Output 752 ml  Net -49 ml   Filed Weights   06/09/19 0335  Weight: 89 kg    Exam: General exam: In no acute distress. Respiratory system: Good air movement and clear to auscultation. Cardiovascular system: S1 & S2 heard, RRR. No JVD. Gastrointestinal system: Abdomen is nondistended, soft and nontender.  Extremities: No pedal edema. Skin: No rashes, lesions or ulcers Psychiatry: Judgement and insight appear normal. Mood & affect appropriate.      Data Reviewed:    Labs: Basic Metabolic Panel: Recent Labs  Lab 06/11/19 0457 06/12/19 0457  NA 137  --   K 4.8  --   CL 108  --   CO2 21*  --   GLUCOSE 86  --   BUN 25*  --   CREATININE 1.13  --   CALCIUM 9.0  --   MG 1.4* 1.7   GFR Estimated Creatinine Clearance: 81.1 mL/min (by C-G formula based on SCr of 1.13  mg/dL). Liver Function Tests: No results for input(s): AST, ALT, ALKPHOS, BILITOT, PROT, ALBUMIN in the last 168 hours. No results for input(s): LIPASE, AMYLASE in the last 168 hours. No results for input(s): AMMONIA in the last 168 hours. Coagulation profile No results for input(s): INR, PROTIME in the last 168 hours. COVID-19 Labs  No results for input(s): DDIMER, FERRITIN, LDH, CRP in the last 72 hours.  Lab Results  Component Value Date   SARSCOV2NAA POSITIVE (A) 06/14/2019   SARSCOV2NAA POSITIVE (A) 06/08/2019    CBC: No results for input(s): WBC, NEUTROABS, HGB, HCT, MCV, PLT in the last 168 hours. Cardiac Enzymes: No results for input(s): CKTOTAL, CKMB,  CKMBINDEX, TROPONINI in the last 168 hours. BNP (last 3 results) No results for input(s): PROBNP in the last 8760 hours. CBG: Recent Labs  Lab 06/15/19 2133 06/16/19 0826 06/16/19 1208 06/16/19 1609 06/16/19 2207  GLUCAP 156* 120* 168* 92 173*   D-Dimer: No results for input(s): DDIMER in the last 72 hours. Hgb A1c: No results for input(s): HGBA1C in the last 72 hours. Lipid Profile: No results for input(s): CHOL, HDL, LDLCALC, TRIG, CHOLHDL, LDLDIRECT in the last 72 hours. Thyroid function studies: No results for input(s): TSH, T4TOTAL, T3FREE, THYROIDAB in the last 72 hours.  Invalid input(s): FREET3 Anemia work up: No results for input(s): VITAMINB12, FOLATE, FERRITIN, TIBC, IRON, RETICCTPCT in the last 72 hours. Sepsis Labs: No results for input(s): PROCALCITON, WBC, LATICACIDVEN in the last 168 hours. Microbiology Recent Results (from the past 240 hour(s))  SARS Coronavirus 2 (CEPHEID - Performed in [facility] hospital lab), Hosp Order     Status: Abnormal   Collection Time: 06/08/19 10:40 AM   Specimen: Nasopharyngeal Swab  Result Value Ref Range Status   SARS Coronavirus 2 POSITIVE (A) NEGATIVE Final    Comment: RESULT CALLED TO, READ BACK BY AND VERIFIED WITH: MEGAN JONES @1154  ON 06/08/2019 BY FMW (NOTE) If result is NEGATIVE SARS-CoV-2 target nucleic acids are NOT DETECTED. The SARS-CoV-2 RNA is generally detectable in upper and lower  respiratory specimens during the acute phase of infection. The lowest  concentration of SARS-CoV-2 viral copies this assay can detect is 250  copies / mL. A negative result does not preclude SARS-CoV-2 infection  and should not be used as the sole basis for treatment or other  patient management decisions.  A negative result may occur with  improper specimen collection / handling, submission of specimen other  than nasopharyngeal swab, presence of viral mutation(s) within the  areas targeted by this assay, and inadequate number  of viral copies  (<250 copies / mL). A negative result must be combined with clinical  observations, patient history, and epidemiological information. If result is POSITIVE SARS-CoV-2 target nucleic acids are DETECTED.  The SARS-CoV-2 RNA is generally detectable in upper and lower  respiratory specimens during the acute phase of infection.  Positive  results are indicative of active infection with SARS-CoV-2.  Clinical  correlation with patient history and other diagnostic information is  necessary to determine patient infection status.  Positive results do  not rule out bacterial infection or co-infection with other viruses. If result is PRESUMPTIVE POSTIVE SARS-CoV-2 nucleic acids MAY BE PRESENT.   A presumptive positive result was obtained on the submitted specimen  and confirmed on repeat testing.  While 2019 novel coronavirus  (SARS-CoV-2) nucleic acids may be present in the submitted sample  additional confirmatory testing may be necessary for epidemiological  and / or clinical management purposes  to differentiate between  SARS-CoV-2 and other Sarbecovirus currently known to infect humans.  If clinically indicated additional testing with an alternate test  methodology [phone])  is advised. The SARS-CoV-2 RNA is generally  detectable in upper and lower respiratory specimens during the acute  phase of infection. The expected result is Negative. Fact Sheet for Patients:  StrictlyIdeas.no Fact Sheet for Healthcare Providers: BankingDealers.co.za This test is not yet approved or cleared by the [place] FDA and has been authorized for detection and/or diagnosis of SARS-CoV-2 by FDA under an Emergency Use Authorization (EUA).  This EUA will remain in effect (meaning this test can be used) for the duration of the COVID-19 declaration under Section 564(b)(1) of the Act, 21 U.S.C. section 360bbb-3(b)(1), unless the authorization is  terminated or revoked sooner. Performed at [facility], [address], [place], [place] [address]   SARS Coronavirus 2 (CEPHEID - Performed in [facility] hospital lab), Hosp Order     Status: Abnormal   Collection Time: 06/14/19  3:30 PM   Specimen: Nasopharyngeal Swab  Result Value Ref Range Status   SARS Coronavirus 2 POSITIVE (A) NEGATIVE Final    Comment: RESULT CALLED TO, READ BACK BY AND VERIFIED WITH: BROWN,A RN @1728  ON 06/15/2019 JACKSON,K (NOTE) If result is NEGATIVE SARS-CoV-2 target nucleic acids are NOT DETECTED. The SARS-CoV-2 RNA is generally detectable in upper and lower  respiratory specimens during the acute phase of infection. The lowest  concentration of SARS-CoV-2 viral copies this assay can detect is 250  copies / mL. A negative result does not preclude SARS-CoV-2 infection  and should not be used as the sole basis for treatment or other  patient management decisions.  A negative result may occur with  improper specimen collection / handling, submission of specimen other  than nasopharyngeal swab, presence of viral mutation(s) within the  areas targeted by this assay, and inadequate number of viral copies  (<250 copies / mL). A negative result must be combined with clinical  observations, patient history, and epidemiological information. If result is POSITIVE SARS-CoV-2 target nucleic acids are DETECTED . The SARS-CoV-2 RNA is generally detectable in upper and lower  respiratory specimens during the acute phase of infection.  Positive  results are indicative of active infection with SARS-CoV-2.  Clinical  correlation with patient history and other diagnostic information is  necessary to determine patient infection status.  Positive results do  not rule out bacterial infection or co-infection with other viruses. If result is PRESUMPTIVE POSTIVE SARS-CoV-2 nucleic acids MAY BE PRESENT.   A presumptive positive result was obtained on the submitted  specimen  and confirmed on repeat testing.  While 2019 novel coronavirus  (SARS-CoV-2) nucleic acids may be present in the submitted sample  additional confirmatory testing may be necessary for epidemiological  and / or clinical management purposes  to differentiate between  SARS-CoV-2 and other Sarbecovirus currently known to infect humans.  If clinically indicated additional testing with an alternate test  methodology [phone] ) is advised. The SARS-CoV-2 RNA is generally  detectable in upper and lower respiratory specimens during the acute  phase of infection. The expected result is Negative. Fact Sheet for Patients:  StrictlyIdeas.no Fact Sheet for Healthcare Providers: BankingDealers.co.za This test is not yet approved or cleared by the [place] FDA and has been authorized for detection and/or diagnosis of SARS-CoV-2 by FDA under an Emergency Use Authorization (EUA).  This EUA will remain in effect (meaning this test can be used) for the  duration of the COVID-19 declaration under Section 564(b)(1) of the Act, 21 U.S.C. section 360bbb-3(b)(1), unless the authorization is terminated or revoked sooner. Performed at [facility], [address] [place], [place] [address]      Medications:   . amLODipine  10 mg Oral Daily  . aspirin EC  81 mg Oral Daily  . atorvastatin  40 mg Oral QHS  . diclofenac sodium  2 g Topical QID  . enoxaparin (LOVENOX) injection  40 mg Subcutaneous Q24H  . entecavir  1 mg Oral Daily  . fluticasone  2 spray Each Nare Daily  . gabapentin  100 mg Oral TID  . insulin aspart  0-5 Units Subcutaneous QHS  . insulin aspart  0-9 Units Subcutaneous TID WC  . isosorbide dinitrate  20 mg Oral TID  . labetalol  200 mg Oral TID  . losartan  25 mg Oral Daily  . pantoprazole  40 mg Oral Daily  . sodium chloride flush  3 mL Intravenous Q12H  . tacrolimus  4 mg Oral BID   Continuous  Infusions:    LOS: 7 days   Marinda ElkAbraham Feliz Ortiz  [facility]  06/17/2019, 8:00 AM

## 2019-06-17 NOTE — Progress Notes (Signed)
Alert and oriented x 4. Afebrile. Anticipating discharge to SNF tomorrow 06/18/2019. O2 SAT within normal limits on room air. Call bell within reach. Encouraged to report signs and symptoms to RN.

## 2019-06-18 LAB — GLUCOSE, CAPILLARY
Glucose-Capillary: 104 mg/dL — ABNORMAL HIGH (ref 70–99)
Glucose-Capillary: 102 mg/dL — ABNORMAL HIGH (ref 70–99)

## 2019-06-18 MED ORDER — TRAMADOL HCL 50 MG PO TABS: 50.0000 mg | ORAL_TABLET | Freq: Four times a day (QID) | ORAL | 0 refills | Status: AC | PRN

## 2019-06-18 NOTE — Discharge Summary (Signed)
Physician Discharge Summary  Terry Ibarra WUJ:[id number] DOB: 1966-12-07 DOA: 06/09/2019  PCP: Gale Journey, MD  Admit date: 06/09/2019 Discharge date: 06/18/2019  Admitted From: Home Disposition:  Home  Recommendations for Outpatient Follow-up:  1. Follow up with PCP in 1-2 weeks 2. Please obtain BMP/CBC in one week   Home Health:No Equipment/Devices:None  Discharge Condition:Stable CODE STATUS:Full Diet recommendation: Heart Healthy   Brief/Interim Summary: 53 y.o. 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  He was a skilled nursing facility where he started having diarrhea, seen at [facility] ER and tested positive for COVID-19 infection  Discharge Diagnoses:  Principal Problem:   COVID-19 virus detected Active Problems:   Dehydration   TBI (traumatic brain injury) (HCC)   DM type 2 (diabetes mellitus, type 2) (HCC)   Hepatitis B   Liver transplanted (HCC) Acute COVID-19 viral detected with significant watery diarrhea: He remained asymptomatic from a pulmonary standpoint. He was treated with IV fluids and electrolytes were repleted, after 3 days his diarrhea resolved. Remained satting greater than 93% on room air. Physical therapy was consulted recommended skilled nursing facility. Patient has been stable for discharge on 06/14/2019 was awaiting insurance approval  Central pontine melanosis: Continue feeding through PEG continue physical therapy. Physical therapy evaluated the patient and recommended skilled nursing facility.  History of hepatitis B/with the liver transplant: No changes made to his medication continue Prograf.  Acute kidney injury: Likely  prerenal due to diarrhea resolved with IV fluid hydration.  Hypomagnesemia: Repleted orally now resolved.  Diabetes mellitus type 2: No changes made to his medication.   Discharge Instructions  Discharge Instructions    Diet - low sodium heart healthy   Complete by: As directed    Diet - low sodium heart healthy   Complete by: As directed    Increase activity slowly   Complete by: As directed    Increase activity slowly   Complete by: As directed    MyChart COVID-19 home monitoring program   Complete by: Jun 18, 2019    Is the patient willing to use the MyChart Mobile App for home monitoring?: No     Allergies as of 06/18/2019   No Known Allergies     Medication List    TAKE these medications   acetaminophen 325 MG tablet Commonly known as: TYLENOL Take 650 mg by mouth every 6 (six) hours.   amLODipine 10 MG tablet Commonly known as: NORVASC Take 10 mg by mouth daily.   aspirin EC 81 MG tablet Take 81 mg by mouth daily.   atorvastatin 40 MG tablet Commonly known as: LIPITOR Take 40 mg by mouth at bedtime.   cyanocobalamin 100 MCG tablet Take 100 mcg by mouth daily.   entecavir 1 MG tablet Commonly known as: BARACLUDE TAKE 1 TABLET BY MOUTH EVERY DAY   fluticasone 50 MCG/ACT nasal spray Commonly known as: FLONASE Place 2 sprays into both nostrils daily.   gabapentin 100 MG capsule Commonly known as: NEURONTIN Take 100 mg by mouth 3 (three) times daily.   insulin regular 100 units/mL injection Commonly known as: NOVOLIN R Inject into the skin. Take 10 units twice daily;  Uses  Sliding scale three times daily with meals; SSI 151-200=2 units; 201-250=4 units; 251-300= 6 units;  301-350= 8 units; 351-400= 10 units   isosorbide dinitrate 20 MG tablet Commonly known as: ISORDIL Take 20 mg by mouth 3 (three) times daily.   labetalol 200 MG tablet Commonly known as: NORMODYNE Take 200 mg by mouth 3 (three) times daily.   loperamide 2 MG capsule Commonly  known as: IMODIUM Take 4 mg by mouth as needed for diarrhea or loose stools.   losartan 25 MG tablet Commonly known as: COZAAR Take 25 mg by mouth daily.   magnesium oxide 400 MG tablet Commonly known as: MAG-OX Take 400 mg by mouth 2 (two) times daily.   metFORMIN 1000 MG tablet Commonly known as: GLUCOPHAGE Take 1,000 mg by mouth 2 (two) times daily with a meal.   nitroGLYCERIN 0.4 MG SL tablet Commonly known as: NITROSTAT Place 0.4 mg under the tongue every 5 (five) minutes as needed for chest pain. Up to three doses   omeprazole 20 MG capsule Commonly known as: PRILOSEC Take 20 mg by mouth 2 (two) times a day.   polyethylene glycol 17 g packet Commonly known as: MIRALAX / GLYCOLAX Take 17 g by mouth daily as needed for mild constipation or moderate constipation.   tacrolimus 1 MG capsule Commonly known as: PROGRAF Take 4 mg by mouth 2 (two) times daily.   traMADol 50 MG tablet Commonly known as: ULTRAM Take 1 tablet (50 mg total) by mouth every 6 (six) hours as needed for moderate pain.       No Known Allergies  Consultations:  None   Procedures/Studies: Ct Abdomen Pelvis W Contrast  Result Date: 06/06/2019 CLINICAL DATA:  Generalized abdominal pain EXAM: CT ABDOMEN AND PELVIS WITH CONTRAST TECHNIQUE: Multidetector CT imaging of the abdomen and pelvis was performed using the standard protocol following bolus administration of intravenous contrast. CONTRAST:  100mL OMNIPAQUE IOHEXOL 300 MG/ML  SOLN COMPARISON:  02/24/2019. FINDINGS: Lower chest: No acute abnormality. Hepatobiliary: The patient appears to be status post prior cholecystectomy. There is some mild intrahepatic and extrahepatic biliary ductal dilatation. Pancreas: Unremarkable. No pancreatic ductal dilatation or surrounding inflammatory changes. Spleen: Normal in size without focal abnormality. Adrenals/Urinary Tract: There is a punctate nonobstructing stone in the upper pole the left kidney. There is no  hydronephrosis the right kidney is unremarkable. The bladder is unremarkable. The bilateral adrenal glands are unremarkable. Stomach/Bowel: There is a well-positioned percutaneous gastrostomy tube in place. There is liquid stool throughout the colon consistent with diarrhea. There is no CT evidence of colitis or diverticulitis. The appendix is mildly dilated without significant periappendiceal inflammatory changes. There is no evidence of a small-bowel obstruction. Vascular/Lymphatic: Aortic atherosclerosis. No enlarged abdominal or pelvic lymph nodes. Reproductive: Prostate is unremarkable. Other: There are bilateral fat containing inguinal hernias, left greater than right. Musculoskeletal: No acute or significant osseous findings. There are multiple old right-sided rib fractures. IMPRESSION: 1. Liquid stool throughout the colon consistent with diarrhea. 2. Well-positioned gastrostomy tube 3. Status post cholecystectomy. There is intrahepatic and extrahepatic biliary ductal dilatation which is stable from prior study. 4. Punctate nonobstructing stone in the upper pole the left kidney. Electronically Signed   By: Katherine Mantlehristopher  Green M.D.   On: 06/06/2019 22:00   Portable Chest 1 View  Result Date: 06/09/2019 CLINICAL DATA:  COVID-19 virus infection. EXAM: PORTABLE CHEST 1 VIEW COMPARISON:  02/24/2019 FINDINGS: Stable mild cardiomegaly. No evidence of pulmonary edema. A new focal nodular area of consolidation is seen in the right upper lobe. Left lung is clear. No evidence of pleural effusion. IMPRESSION: 1.  New focal nodular consolidation in right upper lobe, likely due to infection. Continued radiographic follow-up is recommended to confirm resolution. 2. Stable mild cardiomegaly. Electronically Signed   By: Myles RosenthalJohn  Stahl M.D.   On: 06/09/2019 06:37     Subjective: Complains no further diarrhea in a joyful mood  Discharge Exam: Vitals:   06/18/19 0825 06/18/19 0900  BP:  114/84  Pulse:  71  Resp:     Temp: 97.7 F (36.5 C)   SpO2:       General: Pt is alert, awake, not in acute distress Cardiovascular: RRR, S1/S2 +, no rubs, no gallops Respiratory: CTA bilaterally, no wheezing, no rhonchi Abdominal: Soft, NT, ND, bowel sounds + Extremities: no edema, no cyanosis    The results of significant diagnostics from this hospitalization (including imaging, microbiology, ancillary and laboratory) are listed below for reference.     Microbiology: Recent Results (from the past 240 hour(s))  SARS Coronavirus 2 (CEPHEID - Performed in [facility] hospital lab), Hosp Order     Status: Abnormal   Collection Time: 06/14/19  3:30 PM   Specimen: Nasopharyngeal Swab  Result Value Ref Range Status   SARS Coronavirus 2 POSITIVE (A) NEGATIVE Final    Comment: RESULT CALLED TO, READ BACK BY AND VERIFIED WITH: BROWN,A RN @1728  ON 06/15/2019 JACKSON,K (NOTE) If result is NEGATIVE SARS-CoV-2 target nucleic acids are NOT DETECTED. The SARS-CoV-2 RNA is generally detectable in upper and lower  respiratory specimens during the acute phase of infection. The lowest  concentration of SARS-CoV-2 viral copies this assay can detect is 250  copies / mL. A negative result does not preclude SARS-CoV-2 infection  and should not be used as the sole basis for treatment or other  patient management decisions.  A negative result may occur with  improper specimen collection / handling, submission of specimen other  than nasopharyngeal swab, presence of viral mutation(s) within the  areas targeted by this assay, and inadequate number of viral copies  (<250 copies / mL). A negative result must be combined with clinical  observations, patient history, and epidemiological information. If result is POSITIVE SARS-CoV-2 target nucleic acids are DETECTED . The SARS-CoV-2 RNA is generally detectable in upper and lower  respiratory specimens during the acute phase of infection.  Positive  results are indicative of  active infection with SARS-CoV-2.  Clinical  correlation with patient history and other diagnostic information is  necessary to determine patient infection status.  Positive results do  not rule out bacterial infection or co-infection with other viruses. If result is PRESUMPTIVE POSTIVE SARS-CoV-2 nucleic acids MAY BE PRESENT.   A presumptive positive result was obtained on the submitted specimen  and confirmed on repeat testing.  While 2019 novel coronavirus  (SARS-CoV-2) nucleic acids may be present in the submitted sample  additional confirmatory testing may be necessary for epidemiological  and / or clinical management purposes  to differentiate between  SARS-CoV-2 and other Sarbecovirus currently known to infect humans.  If clinically indicated additional testing with an alternate test  methodology [phone] is advised. The SARS-CoV-2 RNA is generally  detectable in upper and lower respiratory specimens during the acute  phase of infection. The expected result is Negative. Fact Sheet for Patients:  BoilerBrush.com.cyhttps://www.fda.gov/media/136312/download Fact Sheet for Healthcare Providers: https://pope.com/https://www.fda.gov/media/136313/download This test is not yet approved or cleared by the [place] FDA and has been authorized for detection and/or diagnosis of SARS-CoV-2 by FDA under an Emergency Use Authorization (EUA).  This EUA will remain  in effect (meaning this test can be used) for the duration of the COVID-19 declaration under Section 564(b)(1) of the Act, 21 U.S.C. section 360bbb-3(b)(1), unless the authorization is terminated or revoked sooner. Performed at [facility], [address] [place], [place] [address]      Labs: BNP (last 3 results) No results for input(s): BNP in the last 8760 hours. Basic Metabolic Panel: Recent Labs  Lab 06/12/19 0457  MG 1.7   Liver Function Tests: No results for input(s): AST, ALT, ALKPHOS, BILITOT, PROT, ALBUMIN in the last 168  hours. No results for input(s): LIPASE, AMYLASE in the last 168 hours. No results for input(s): AMMONIA in the last 168 hours. CBC: No results for input(s): WBC, NEUTROABS, HGB, HCT, MCV, PLT in the last 168 hours. Cardiac Enzymes: No results for input(s): CKTOTAL, CKMB, CKMBINDEX, TROPONINI in the last 168 hours. BNP: Invalid input(s): POCBNP CBG: Recent Labs  Lab 06/17/19 0818 06/17/19 1206 06/17/19 1659 06/17/19 2018 06/18/19 0826  GLUCAP 104* 127* 116* 142* 104*   D-Dimer No results for input(s): DDIMER in the last 72 hours. Hgb A1c No results for input(s): HGBA1C in the last 72 hours. Lipid Profile No results for input(s): CHOL, HDL, LDLCALC, TRIG, CHOLHDL, LDLDIRECT in the last 72 hours. Thyroid function studies No results for input(s): TSH, T4TOTAL, T3FREE, THYROIDAB in the last 72 hours.  Invalid input(s): FREET3 Anemia work up No results for input(s): VITAMINB12, FOLATE, FERRITIN, TIBC, IRON, RETICCTPCT in the last 72 hours. Urinalysis    Component Value Date/Time   COLORURINE YELLOW (A) 06/06/2019 2158   APPEARANCEUR HAZY (A) 06/06/2019 2158   LABSPEC 1.025 06/06/2019 2158   PHURINE 5.0 06/06/2019 2158   GLUCOSEU NEGATIVE 06/06/2019 2158   HGBUR NEGATIVE 06/06/2019 2158   BILIRUBINUR NEGATIVE 06/06/2019 2158   KETONESUR NEGATIVE 06/06/2019 2158   PROTEINUR NEGATIVE 06/06/2019 2158   NITRITE NEGATIVE 06/06/2019 2158   LEUKOCYTESUR NEGATIVE 06/06/2019 2158   Sepsis Labs Invalid input(s): PROCALCITONIN,  WBC,  LACTICIDVEN Microbiology Recent Results (from the past 240 hour(s))  SARS Coronavirus 2 (CEPHEID - Performed in [facility] hospital lab), Hosp Order     Status: Abnormal   Collection Time: 06/14/19  3:30 PM   Specimen: Nasopharyngeal Swab  Result Value Ref Range Status   SARS Coronavirus 2 POSITIVE (A) NEGATIVE Final    Comment: RESULT CALLED TO, READ BACK BY AND VERIFIED WITH: BROWN,A RN @1728  ON 06/15/2019 JACKSON,K (NOTE) If result is  NEGATIVE SARS-CoV-2 target nucleic acids are NOT DETECTED. The SARS-CoV-2 RNA is generally detectable in upper and lower  respiratory specimens during the acute phase of infection. The lowest  concentration of SARS-CoV-2 viral copies this assay can detect is 250  copies / mL. A negative result does not preclude SARS-CoV-2 infection  and should not be used as the sole basis for treatment or other  patient management decisions.  A negative result may occur with  improper specimen collection / handling, submission of specimen other  than nasopharyngeal swab, presence of viral mutation(s) within the  areas targeted by this assay, and inadequate number of viral copies  (<250 copies / mL). A negative result must be combined with clinical  observations, patient history, and epidemiological information. If result is POSITIVE SARS-CoV-2 target nucleic acids are DETECTED . The SARS-CoV-2 RNA is generally detectable in upper and lower  respiratory specimens during the acute phase of infection.  Positive  results are indicative of active infection with SARS-CoV-2.  Clinical  correlation with patient  history and other diagnostic information is  necessary to determine patient infection status.  Positive results do  not rule out bacterial infection or co-infection with other viruses. If result is PRESUMPTIVE POSTIVE SARS-CoV-2 nucleic acids MAY BE PRESENT.   A presumptive positive result was obtained on the submitted specimen  and confirmed on repeat testing.  While 2019 novel coronavirus  (SARS-CoV-2) nucleic acids may be present in the submitted sample  additional confirmatory testing may be necessary for epidemiological  and / or clinical management purposes  to differentiate between  SARS-CoV-2 and other Sarbecovirus currently known to infect humans.  If clinically indicated additional testing with an alternate test  methodology [phone] ) is advised. The SARS-CoV-2 RNA is generally  detectable  in upper and lower respiratory specimens during the acute  phase of infection. The expected result is Negative. Fact Sheet for Patients:  StrictlyIdeas.no Fact Sheet for Healthcare Providers: BankingDealers.co.za This test is not yet approved or cleared by the [place] FDA and has been authorized for detection and/or diagnosis of SARS-CoV-2 by FDA under an Emergency Use Authorization (EUA).  This EUA will remain in effect (meaning this test can be used) for the duration of the COVID-19 declaration under Section 564(b)(1) of the Act, 21 U.S.C. section 360bbb-3(b)(1), unless the authorization is terminated or revoked sooner. Performed at [facility], [address] [place], [place] [address]      Time coordinating discharge: 40 minutes  SIGNED:   Charlynne Cousins, MD  [facility]

## 2019-06-18 NOTE — Progress Notes (Signed)
Physical Therapy Treatment Patient Details Name: Terry Ibarra MRN: [id number] DOB: March 12, 1966 Today's Date: 06/18/2019    History of Present Illness Pt adm after slid out of recliner at home and subsequently found to be Covid + (asymptomatic. Pt had been at brother's home since dc from SNF 1 week earlier after TBI in february. Brother unable to provide care. PMH - colonic injury, retrobulbar hemorrhage, posterior sixth rib fracture and traumatic subarachnoid hemorrhage, traumatic brain injury with residual weakness 01/2019, hepatitis B, hepatitis C, liver transplant 2017, latent tuberculosis    PT Comments    Patient continues to be highly motivated. Tolerated standing in Stedy better today, however continued with hypotension (Supine BP 121/86 HR 74, standing 1 min 114/76 HR 95, seated 99/84 HR 84). Reported mild dizziness with changes in position. Able to sit to stand with varying assist over 4 trials (+2 mod assist to +1 min-mod assist from elevated surface). Sitting balance between transfers improved.     Follow Up Recommendations  Supervision/Assistance - 24 hour;SNF(CIR denied admission)     Equipment Recommendations  Wheelchair (measurements PT);Wheelchair cushion (measurements PT);3in1 (PT)    Recommendations for Other Services       Precautions / Restrictions Precautions Precautions: Fall Precaution Comments: ataxic Restrictions Weight Bearing Restrictions: No    Mobility  Bed Mobility Overal bed mobility: Needs Assistance Bed Mobility: Rolling;Sidelying to Sit;Sit to Sidelying Rolling: Min assist Sidelying to sit: Mod assist(HOB 10, + rail)     Sit to sidelying: +2 for physical assistance;Max assist General bed mobility comments: A to lift BLE onto bed; unaware of position of R shoulder when returning to sidelying  Transfers Overall transfer level: Needs assistance   Transfers: Sit to/from Stand Sit to Stand: Min assist;+2 safety/equipment;From elevated  surface         General transfer comment: Stood x 4; PT sit - stand varies form mod A to Min A +2. vc to come forward; facilitation of anterior weight shift  Ambulation/Gait                 Stairs             Wheelchair Mobility    Modified Rankin (Stroke Patients Only)       Balance Overall balance assessment: Needs assistance Sitting-balance support: Feet supported;No upper extremity supported Sitting balance-Leahy Scale: Fair Sitting balance - Comments: Able to sit on stedy with out UE support; ableto maintain midline trunk control while reaching with BUE; slow controlled movements; trunkal ataxia noted   Standing balance support: Bilateral upper extremity supported Standing balance-Leahy Scale: Poor Standing balance comment: Pt able to stand in the steady standing frame multiple times for multiple minutes each time.  Cues for upright posture, extended hips, practiced weight shifting to his left hip and leg (as he has a tendancy to shift right), Patient's BP dropped over course of session 121/86 to 99/84                            Cognition Arousal/Alertness: Awake/alert Behavior During Therapy: [facility] for tasks assessed/performed Overall Cognitive Status: History of cognitive impairments - at baseline                                        Exercises General Exercises - Lower Extremity Straight Leg Raises: AROM;Strengthening;Left;10 reps Other Exercises Other Exercises: left LE bridges  with 5 sec hold x 7  reps    General Comments        Pertinent Vitals/Pain Pain Assessment: Faces Faces Pain Scale: Hurts little more Pain Location: R shoulder; legs; general discomfort Pain Descriptors / Indicators: Discomfort Pain Intervention(s): Limited activity within patient's tolerance;Monitored during session;Repositioned    Home Living                      Prior Function            PT Goals (current goals can now be  found in the care plan section) Acute Rehab PT Goals Patient Stated Goal: to get more therapy Time For Goal Achievement: 06/23/19 Progress towards PT goals: Progressing toward goals    Frequency    Min 3X/week      PT Plan Current plan remains appropriate    Co-evaluation PT/OT/SLP Co-Evaluation/Treatment: Yes Reason for Co-Treatment: Complexity of the patient's impairments (multi-system involvement);For patient/therapist safety PT goals addressed during session: Mobility/safety with mobility;Balance;Strengthening/ROM OT goals addressed during session: ADL's and self-care;Strengthening/ROM      AM-PAC PT "6 Clicks" Mobility   Outcome Measure  Help needed turning from your back to your side while in a flat bed without using bedrails?: A Little Help needed moving from lying on your back to sitting on the side of a flat bed without using bedrails?: A Lot Help needed moving to and from a bed to a chair (including a wheelchair)?: Total Help needed standing up from a chair using your arms (e.g., wheelchair or bedside chair)?: Total Help needed to walk in hospital room?: Total Help needed climbing 3-5 steps with a railing? : Total 6 Click Score: 9    End of Session Equipment Utilized During Treatment: Other (comment)(Stedy) Activity Tolerance: Treatment limited secondary to medical complications (Comment)(decr BP) Patient left: in bed;with call bell/phone within reach   PT Visit Diagnosis: Other abnormalities of gait and mobility (R26.89);Other symptoms and signs involving the nervous system (R29.898);Difficulty in walking, not elsewhere classified (R26.2)     Time: [id number] PT Time Calculation (min) (ACUTE ONLY): 53 min  Charges:  $Therapeutic Exercise: 8-22 mins $Therapeutic Activity: 8-22 mins                        [employer]Lynn P Caelie Ibarra, PT 06/18/2019, 12:22 PM

## 2019-06-18 NOTE — TOC Transition Note (Signed)
Transition of Care [facility]) - CM/SW Discharge Note   Patient Details  Name: Terry Ibarra MRN: [id number] Date of Birth: 01/24/1966  Transition of Care [facility]) CM/SW Contact:  Weston Anna, LCSW Phone Number: 06/18/2019, 2:49 PM   Clinical Narrative:    Patient stable for discharge to [facility]- number for report given to RN. PTAR called for transportation.   Voicemail left for return call for POA, [facility], to notify her of patient leaving hospital. Will continue to follow up.    Final next level of care: Skilled Nursing Facility Barriers to Discharge: No Barriers Identified   Patient Goals and CMS Choice   CMS Medicare.gov Compare Post Acute Care list provided to:: Patient Represenative (must comment)([facility] (POA)) Choice offered to / list presented to : [facility] POA / Guardian([facility])  Discharge Placement              Patient chooses bed at: [facility] [place] Patient to be transferred to facility by: ptar   Patient and family notified of of transfer: 06/18/19  Discharge Plan and Services     Post Acute Care Choice: [place]                               Social Determinants of Health (SDOH) Interventions     Readmission Risk Interventions No flowsheet data found.

## 2019-06-18 NOTE — Progress Notes (Signed)
Occupational Therapy Treatment Patient Details Name: Terry Ibarra MRN: [id number] DOB: 09/14/1966 Today's Date: 06/18/2019    History of present illness Pt adm after slid out of recliner at home and subsequently found to be Covid + (asymptomatic. Pt had been at brother's home since dc from SNF 1 week earlier after TBI in february. Brother unable to provide care. 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 Able to brush his teeth while seated on Stedy with improved trunk control at midline. BP stable initially in sitting 120/85 and began to drop after in semi - standing position on stedy after @ 10 min to 104/82. Min complaints of feeling "dizzy". Continue to recommend SNF for rehab. Will try to return to give HEP for BUE prior to DC.   Follow Up Recommendations  SNF    Equipment Recommendations  None recommended by OT    Recommendations for Other Services      Precautions / Restrictions Precautions Precautions: Fall Precaution Comments: ataxic       Mobility Bed Mobility Overal bed mobility: Needs Assistance Bed Mobility: Rolling;Sidelying to Sit;Sit to Sidelying Rolling: Min assist Sidelying to sit: Mod assist     Sit to sidelying: Mod assist;+2 for physical assistance General bed mobility comments: A to lift BLE onto bed; unaware of position of R shoulder when returning to sidelying  Transfers Overall transfer level: Needs assistance   Transfers: Sit to/from Stand Sit to Stand: +2 physical assistance;Min assist;From elevated surface         General transfer comment: PT sit - stand varies form mod A to Min A +2. vc to come forward; facilitation of anterior  weight shift    Balance     Sitting balance-Leahy Scale: Fair Sitting balance - Comments: Able to sit on stedy with out UE support; ableto maintain midline trunk control while reaching with BUE; slow controlled movements; trunkal ataxia noted                                   ADL either performed or assessed with clinical judgement   ADL Overall ADL's : Needs assistance/impaired     Grooming: Oral care;Set up;Sitting Grooming Details (indicate cue type and reason): red tubing on toothbrush; brushed teeth while sitting on Stedy; facilitation for pt to reach forward while keeping trunk at midline                             Functional mobility during ADLs: Moderate assistance;+2 for physical assistance       Vision       Perception     Praxis      Cognition Arousal/Alertness: Awake/alert Behavior During Therapy: WFL for tasks assessed/performed Overall Cognitive Status: History of cognitive impairments - at baseline                                          Exercises Other Exercises Other Exercises: scapular mob followed by PNF diagonals   Shoulder Instructions       General Comments  Pertinent Vitals/ Pain       Pain Assessment: Faces Faces Pain Scale: Hurts little more Pain Location: R shoulder; legs; general discomfort Pain Descriptors / Indicators: Discomfort Pain Intervention(s): Limited activity within patient's tolerance  Home Living                                          Prior Functioning/Environment              Frequency  Min 3X/week        Progress Toward Goals  OT Goals(current goals can now be found in the care plan section)  Progress towards OT goals: Progressing toward goals  Acute Rehab OT Goals Patient Stated Goal: to get more therapy OT Goal Formulation: With patient Time For Goal Achievement: 06/24/19 Potential to Achieve Goals: Good ADL Goals Pt Will  Perform Eating: with set-up;with adaptive utensils;sitting;bed level Pt Will Perform Grooming: with set-up;with supervision;with adaptive equipment;sitting Pt Will Perform Upper Body Bathing: with min guard assist;sitting Pt Will Perform Upper Body Dressing: with min assist;sitting Pt/caregiver will Perform Home Exercise Program: Increased strength;Increased ROM;Both right and left upper extremity;With written HEP provided  Plan Discharge plan remains appropriate    Co-evaluation    PT/OT/SLP Co-Evaluation/Treatment: Yes Reason for Co-Treatment: Complexity of the patient's impairments (multi-system involvement);To address functional/ADL transfers   OT goals addressed during session: ADL's and self-care;Strengthening/ROM      AM-PAC OT "6 Clicks" Daily Activity     Outcome Measure   Help from another person eating meals?: A Little Help from another person taking care of personal grooming?: A Little Help from another person toileting, which includes using toliet, bedpan, or urinal?: A Lot Help from another person bathing (including washing, rinsing, drying)?: A Lot Help from another person to put on and taking off regular upper body clothing?: A Little Help from another person to put on and taking off regular lower body clothing?: A Lot 6 Click Score: 15    End of Session    OT Visit Diagnosis: Other abnormalities of gait and mobility (R26.89);Muscle weakness (generalized) (M62.81)   Activity Tolerance Patient tolerated treatment well   Patient Left in bed;with call bell/phone within reach   Nurse Communication Mobility status;Need for lift equipment        Time: [id number] OT Time Calculation (min): 42 min  Charges: OT General Charges $OT Visit: 1 Visit OT Treatments $Self Care/Home Management : 8-22 mins  Luisa DagoHilary Moshe Wenger, OT/L   Acute OT Clinical Specialist Acute Rehabilitation Services Pager [phone] Office [phone]    [facility],[name] 06/18/2019, 10:00  AM

## 2019-06-19 LAB — GLUCOSE, CAPILLARY
Glucose-Capillary: 97 mg/dL (ref 70–99)
Glucose-Capillary: 178 mg/dL — ABNORMAL HIGH (ref 70–99)
Glucose-Capillary: 95 mg/dL (ref 70–99)
Glucose-Capillary: 114 mg/dL — ABNORMAL HIGH (ref 70–99)
Glucose-Capillary: 121 mg/dL — ABNORMAL HIGH (ref 70–99)
Glucose-Capillary: 104 mg/dL — ABNORMAL HIGH (ref 70–99)

## 2019-06-19 NOTE — Plan of Care (Signed)
  Problem: Education: Goal: Knowledge of risk factors and measures for prevention of condition will improve Outcome: Progressing   Problem: Coping: Goal: Psychosocial and spiritual needs will be supported Outcome: Progressing   Problem: Respiratory: Goal: Will maintain a patent airway Outcome: Progressing Goal: Complications related to the disease process, condition or treatment will be avoided or minimized Outcome: Progressing   

## 2019-06-19 NOTE — Progress Notes (Addendum)
Occupational Therapy Treatment Patient Details Name: Terry Ibarra MRN: [id number] DOB: 09-15-1966 Today's Date: 06/19/2019    History of present illness Pt adm after slid out of recliner at home and subsequently found to be Covid + (asymptomatic. Pt had been at brother's home since dc from SNF 1 week earlier after TBI in february. Brother unable to provide care. 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 Pt performing PNF patterns with scapular stabilization to increase ROM at right shoulder. Challenging trunk control and postural stability with pt performing lateral leaning and forward bending at EOB. Continue to recommend post-acute rehab and will continue to follow acutely as admitted.    Follow Up Recommendations  SNF    Equipment Recommendations  None recommended by OT    Recommendations for Other Services      Precautions / Restrictions Precautions Precautions: Fall Precaution Comments: ataxic Restrictions Weight Bearing Restrictions: No       Mobility Bed Mobility Overal bed mobility: Needs Assistance Bed Mobility: Rolling;Sidelying to Sit;Sit to Sidelying Rolling: Min assist Sidelying to sit: Mod assist(HOB 10, + rail) Supine to sit: Max assist     General bed mobility comments: Pt able to bring BLEs over EOB. Mod A to elevate trunk. Max A to assist in returning to bed as pt with increased pain at right shoulder  Transfers                      Balance Overall balance assessment: Needs assistance Sitting-balance support: Feet supported;No upper extremity supported Sitting balance-Leahy Scale: Fair Sitting balance - Comments: Able to sit on stedy with out UE support;  ableto maintain midline trunk control while reaching with BUE; slow controlled movements; trunkal ataxia noted Postural control: Posterior lean;Right lateral lean                                 ADL either performed or assessed with clinical judgement   ADL Overall ADL's : Needs assistance/impaired Eating/Feeding: Minimal assistance;Bed level Eating/Feeding Details (indicate cue type and reason): Pt requiring increased time for taking lid off cup and then pour more ice into adapted cup. Min A for pushing lid back on as pt with decreased strength Grooming: Wash/dry hands;Bed level Grooming Details (indicate cue type and reason): Pt applying lotion on his hands with Min A to squeeze lotion bottle with right hand.               Lower Body Dressing Details (indicate cue type and reason): At bed level with HOB elevated, pt able to bring feet towards hips to pull up socks. Pt requiring significant amount of time and increased effort. Noting decreased coordination               General ADL Comments: Pt participating in sitting at EOB and performing trunk exercises for increasing balance.      Vision       Perception     Praxis      Cognition Arousal/Alertness: Awake/alert Behavior During Therapy: WFL for tasks assessed/performed Overall Cognitive Status: History of cognitive impairments - at baseline  General Comments: most likely baseline cognition; note deficits with self regulatory behavior; making good progress        Exercises Exercises: Other exercises General Exercises - Upper Extremity Shoulder Flexion: AAROM;AROM;Both;10 reps Elbow Flexion: AROM;Both;10 reps Elbow Extension: AROM;Both;10 reps Wrist Flexion: AROM;Both;10 reps Wrist Extension: AROM;Both;10 reps Digit Composite Flexion: AROM;Both;10 reps Composite Extension: AROM;Both;10 reps Other Exercises Other Exercises: rythmic stabilization for  shoulder girlde and core strengthening Other Exercises: PNF D1/D2 patterns in supine Other Exercises: Laterally leaning left and right to challnge posture control and trunk stability.  Other Exercises: Forward bending to pick object off floor and returning to sitting to challenge core strength. 3 reps.   Shoulder Instructions       General Comments SpO2 >90% on RA    Pertinent Vitals/ Pain       Pain Assessment: Faces Faces Pain Scale: Hurts little more Pain Location: R shoulder; legs; general discomfort Pain Descriptors / Indicators: Discomfort Pain Intervention(s): Monitored during session;Limited activity within patient's tolerance;Repositioned  Home Living                                          Prior Functioning/Environment              Frequency  Min 3X/week        Progress Toward Goals  OT Goals(current goals can now be found in the care plan section)  Progress towards OT goals: Progressing toward goals  Acute Rehab OT Goals Patient Stated Goal: to get more therapy OT Goal Formulation: With patient Time For Goal Achievement: 06/24/19 Potential to Achieve Goals: Good ADL Goals Pt Will Perform Eating: with set-up;with adaptive utensils;sitting;bed level Pt Will Perform Grooming: with set-up;with supervision;with adaptive equipment;sitting Pt Will Perform Upper Body Bathing: with min guard assist;sitting Pt Will Perform Upper Body Dressing: with min assist;sitting Pt/caregiver will Perform Home Exercise Program: Increased strength;Increased ROM;Both right and left upper extremity;With written HEP provided  Plan Discharge plan remains appropriate    Co-evaluation                 AM-PAC OT "6 Clicks" Daily Activity     Outcome Measure   Help from another person eating meals?: A Little Help from another person taking care of personal grooming?: A Little Help from another person toileting, which includes using toliet, bedpan, or  urinal?: A Lot Help from another person bathing (including washing, rinsing, drying)?: A Lot Help from another person to put on and taking off regular upper body clothing?: A Little Help from another person to put on and taking off regular lower body clothing?: A Lot 6 Click Score: 15    End of Session    OT Visit Diagnosis: Other abnormalities of gait and mobility (R26.89);Muscle weakness (generalized) (M62.81)   Activity Tolerance Patient tolerated treatment well   Patient Left in bed;with call bell/phone within reach   Nurse Communication Mobility status;Need for lift equipment        Time: 1550-1627 OT Time Calculation (min): 37 min  Charges: OT General Charges $OT Visit: 1 Visit OT Treatments $Self Care/Home Management : 8-22 mins $Therapeutic Exercise: 8-22 mins  Dan Dissinger MSOT, OTR/L Acute Rehab Pager: [phone] Office: [phone]   Theodoro GristCharis M Alik Mawson 06/19/2019, 5:42 PM

## 2019-06-19 NOTE — Progress Notes (Signed)
[facility] PROGRESS NOTE    Progress Note  Terry Ibarra  NFA:[id number] DOB: [birth year] DOA: 06/09/2019 PCP: Terry JourneyWalsh, Catherine P, MD     Brief Narrative:   Terry Ibarra is an 53 y.o. 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  He was a skilled nursing facility where he started having diarrhea, seen at [facility] and tested positive for COVID-19 infection  Assessment/Plan:   Acute COVID-19 virus detected with significant watery diarrhea: Lungs seem to be clear he is satting greater than 93% on room air. Family interfere with placement. Awaiting skilled nursing facility placement. Patient has been very difficult to place when is not the family is the insurance or the facility. We will repeat the SARS-CoV-2 PCR's facility is requesting it for probable discharge tomorrow.  Central melanosis/chronic right-sided weakness: Peg-tube in place, physical therapy recommended skilled nursing facility. Continue tube feedings.  History of hepatitis B/with liver transplant.: Continue Prograf.  Acute kidney injury: Likely prerenal due to diarrhea resolved with IV fluid hydration.  Hypomagnesemia: Repleted orally.  Diabetes mellitus type 2: Continue sliding scale insulin.  Oral hypoglycemic agents have been held these can be resumed as an outpatient.    DVT prophylaxis: lovenxo Family Communication:none Disposition Plan/Barrier to D/C: SNF  In am Code Status:     Code Status Orders  (From admission, onward)         Start     Ordered   06/09/19 0530  Full code  Continuous     06/09/19 0532        Code Status History    This patient has a current code status  but no historical code status.   Advance Care Planning Activity        IV Access:    Peripheral IV   Procedures and diagnostic studies:   No results found.   Medical Consultants:    None.  Anti-Infectives:    Subjective:    Terry Ibarra feels great no new complaints  Objective:    Vitals:   06/18/19 0900 06/18/19 1511 06/18/19 2005 06/19/19 0400  BP: 114/84 127/85 111/71 112/87  Pulse: 71 80    Resp:    18  Temp:  98.2 F (36.8 C) 98.3 F (36.8 C) 98.1 F (36.7 C)  TempSrc:   Oral Oral  SpO2:  100%    Weight:    (Ibarra) 93.2 kg  Height:        Intake/Output Summary (Last 24 hours) at 06/19/2019 0727 Last data filed at 06/19/2019 0431 Gross per 24 hour  Intake 720 ml  Output 750 ml  Net -30 ml   Filed Weights   06/09/19 0335 06/18/19 0258 06/19/19 0400  Weight: 89 kg 94.1 kg (Ibarra) 93.2 kg    Exam: General exam: In no acute distress. Respiratory system: Good air movement and clear to auscultation. Cardiovascular system: S1 & S2 heard, RRR. No JVD. Gastrointestinal system: Abdomen is nondistended, soft and nontender.  Extremities: No pedal edema. Skin: No rashes, lesions or ulcers Psychiatry: Judgement and insight appear normal. Mood & affect appropriate.      Data Reviewed:    Labs: Basic Metabolic Panel: No results for input(s): NA, K, CL, CO2, GLUCOSE, BUN, CREATININE, CALCIUM, MG, PHOS in the last 168  hours. GFR Estimated Creatinine Clearance: 88 mL/min (by C-G formula based on SCr of 1.13 mg/dL). Liver Function Tests: No results for input(s): AST, ALT, ALKPHOS, BILITOT, PROT, ALBUMIN in the last 168 hours. No results for input(s): LIPASE, AMYLASE in the last 168 hours. No results for input(s): AMMONIA in the last 168 hours. Coagulation profile No results for input(s): INR, PROTIME in the last 168 hours. COVID-19 Labs  No results for input(s): DDIMER, FERRITIN, LDH, CRP in the last 72 hours.  Lab Results  Component Value Date    SARSCOV2NAA POSITIVE (A) 06/14/2019   SARSCOV2NAA POSITIVE (A) 06/08/2019    CBC: No results for input(s): WBC, NEUTROABS, HGB, HCT, MCV, PLT in the last 168 hours. Cardiac Enzymes: No results for input(s): CKTOTAL, CKMB, CKMBINDEX, TROPONINI in the last 168 hours. BNP (last 3 results) No results for input(s): PROBNP in the last 8760 hours. CBG: Recent Labs  Lab 06/17/19 1206 06/17/19 1659 06/17/19 2018 06/18/19 0826 06/18/19 1215  GLUCAP 127* 116* 142* 104* 102*   D-Dimer: No results for input(s): DDIMER in the last 72 hours. Hgb A1c: No results for input(s): HGBA1C in the last 72 hours. Lipid Profile: No results for input(s): CHOL, HDL, LDLCALC, TRIG, CHOLHDL, LDLDIRECT in the last 72 hours. Thyroid function studies: No results for input(s): TSH, T4TOTAL, T3FREE, THYROIDAB in the last 72 hours.  Invalid input(s): FREET3 Anemia work up: No results for input(s): VITAMINB12, FOLATE, FERRITIN, TIBC, IRON, RETICCTPCT in the last 72 hours. Sepsis Labs: No results for input(s): PROCALCITON, WBC, LATICACIDVEN in the last 168 hours. Microbiology Recent Results (from the past 240 hour(s))  SARS Coronavirus 2 (CEPHEID - Performed [facility] lab), Hosp Order     Status: Abnormal   Collection Time: 06/14/19  3:30 PM   Specimen: Nasopharyngeal Swab  Result Value Ref Range Status   SARS Coronavirus 2 POSITIVE (A) NEGATIVE Final    Comment: RESULT CALLED TO, READ BACK BY AND VERIFIED WITH: BROWN,A RN @1728  ON 06/15/2019 JACKSON,K (NOTE) If result is NEGATIVE SARS-CoV-2 target nucleic acids are NOT DETECTED. The SARS-CoV-2 RNA is generally detectable in upper and lower  respiratory specimens during the acute phase of infection. The lowest  concentration of SARS-CoV-2 viral copies this assay can detect is 250  copies / mL. A negative result does not preclude SARS-CoV-2 infection  and should not be used as the sole basis for treatment or other  patient management  decisions.  A negative result may occur with  improper specimen collection / handling, submission of specimen other  than nasopharyngeal swab, presence of viral mutation(s) within the  areas targeted by this assay, and inadequate number of viral copies  (<250 copies / mL). A negative result must be combined with clinical  observations, patient history, and epidemiological information. If result is POSITIVE SARS-CoV-2 target nucleic acids are DETECTED . The SARS-CoV-2 RNA is generally detectable in upper and lower  respiratory specimens during the acute phase of infection.  Positive  results are indicative of active infection with SARS-CoV-2.  Clinical  correlation with patient history and other diagnostic information is  necessary to determine patient infection status.  Positive results do  not rule out bacterial infection or co-infection with other viruses. If result is PRESUMPTIVE POSTIVE SARS-CoV-2 nucleic acids MAY BE PRESENT.   A presumptive positive result was obtained on the submitted specimen  and confirmed on repeat testing.  While 2019 novel coronavirus  (SARS-CoV-2) nucleic acids may be present in the submitted sample  additional  confirmatory testing may be necessary for epidemiological  and / or clinical management purposes  to differentiate between  SARS-CoV-2 and other Sarbecovirus currently known to infect humans.  If clinically indicated additional testing with an alternate test  methodology [phone] ) is advised. The SARS-CoV-2 RNA is generally  detectable in upper and lower respiratory specimens during the acute  phase of infection. The expected result is Negative. Fact Sheet for Patients:  StrictlyIdeas.no Fact Sheet for Healthcare Providers: BankingDealers.co.za This test is not yet approved or cleared by the [place] FDA and has been authorized for detection and/or diagnosis of SARS-CoV-2 by FDA under an  Emergency Use Authorization (EUA).  This EUA will remain in effect (meaning this test can be used) for the duration of the COVID-19 declaration under Section 564(b)(1) of the Act, 21 U.S.C. section 360bbb-3(b)(1), unless the authorization is terminated or revoked sooner. Performed at [facility], [place] [address] [place], [place] [address]      Medications:   . amLODipine  10 mg Oral Daily  . aspirin EC  81 mg Oral Daily  . atorvastatin  40 mg Oral QHS  . diclofenac sodium  2 g Topical QID  . enoxaparin (LOVENOX) injection  40 mg Subcutaneous Q24H  . entecavir  1 mg Oral Daily  . fluticasone  2 spray Each Nare Daily  . gabapentin  100 mg Oral TID  . insulin aspart  0-5 Units Subcutaneous QHS  . insulin aspart  0-9 Units Subcutaneous TID WC  . isosorbide dinitrate  20 mg Oral TID  . labetalol  200 mg Oral TID  . losartan  25 mg Oral Daily  . pantoprazole  40 mg Oral Daily  . sodium chloride flush  3 mL Intravenous Q12H  . tacrolimus  4 mg Oral BID   Continuous Infusions:    LOS: 9 days   Charlynne Cousins  [facility]  06/19/2019, 7:27 AM

## 2019-06-19 NOTE — Progress Notes (Signed)
CSW spoke with Suanne Marker who reports [place] will have a bed avail for patient tomorrow. This is family's preference, as they did not want to go to [facility]. Rhonda requesting updated COVID test for patient's discharge tomorrow, MD notified.   CSW reached out to [place] [facility]) who is happy to hear that [place] has a bed tomorrow and she feels good knowing patient does not have to go to [facility].   Plan for patient's discharge to [facility] 6/24.   [place], [place]

## 2019-06-20 LAB — GLUCOSE, CAPILLARY
Glucose-Capillary: 97 mg/dL (ref 70–99)
Glucose-Capillary: 137 mg/dL — ABNORMAL HIGH (ref 70–99)

## 2019-06-20 LAB — SARS CORONAVIRUS 2 BY RT PCR (HOSPITAL ORDER, PERFORMED IN ~~LOC~~ HOSPITAL LAB): SARS Coronavirus 2: POSITIVE — AB

## 2019-06-20 NOTE — Plan of Care (Signed)

## 2019-06-20 NOTE — Progress Notes (Signed)
Spoke with patient's POA [facility] and provided full update/answered all questions.

## 2019-06-20 NOTE — TOC Transition Note (Signed)
Transition of Care [facility]) - CM/SW Discharge Note   Patient Details  Name: Terry Ibarra MRN: [id number] Date of Birth: [birth year]  [facility] CM/SW Contact:  Alberteen Sam, LCSW Phone Number: 06/20/2019, 12:49 PM   Clinical Narrative:     Patient will DC to: [facility] Anticipated DC date: 06/20/2019 Family notified: [facility] Transport by: Corey Harold  Per MD patient ready for DC to [facility] . RN, patient, patient's family, and facility notified of DC. Discharge Summary sent to facility. RN given number for report [phone]. DC packet on chart. Ambulance transport requested for patient.  CSW signing off.  [place], [place]   Final next level of care: Skilled Nursing Facility Barriers to Discharge: No Barriers Identified   Patient Goals and CMS Choice Patient states their goals for this hospitalization and ongoing recovery are:: to go to rehab CMS Medicare.gov Compare Post Acute Care list provided to:: Patient Represenative (must comment)([facility]) Choice offered to / list presented to : [facility] POA / Guardian([facility])  Discharge Placement PASRR number recieved: 06/15/19            Patient chooses bed at: [facility] Patient to be transferred to facility by: [place] Name of family member notified: [facility] Patient and family notified of of transfer: 06/20/19  Discharge Plan and Services     Post Acute Care Choice: [place]                               Social Determinants of Health (SDOH) Interventions     Readmission Risk Interventions No flowsheet data found.

## 2019-06-20 NOTE — Progress Notes (Signed)
Patient seen and examined this morning. Remains stable without new complaints. Note discharge summary in chart 6/22 by Dr. Aileen Fass. This was reviewed and remains correct and complete. Discussed with CSW. Will discharge today to [facility], [place] after repeat testing confirms positivity.   Vance Gather, MD 06/20/2019 12:16 PM
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
